# Patient Record
Sex: Male | Born: 1950 | Race: White | Hispanic: No | Marital: Married | Smoking: Never smoker
Health system: Southern US, Community
[De-identification: ages and names within clinical notes are randomized; demographics above are authoritative.]

## PROBLEM LIST (undated history)

## (undated) DIAGNOSIS — I10 Essential (primary) hypertension: Secondary | ICD-10-CM

## (undated) DIAGNOSIS — M1990 Unspecified osteoarthritis, unspecified site: Secondary | ICD-10-CM

## (undated) HISTORY — PX: COLONOSCOPY: SHX174

## (undated) HISTORY — PX: KNEE SURGERY: SHX244

## (undated) HISTORY — DX: Essential (primary) hypertension: I10

## (undated) HISTORY — PX: OTHER SURGICAL HISTORY: SHX169

## (undated) HISTORY — DX: Hemochromatosis, unspecified: E83.119

## (undated) HISTORY — DX: Unspecified osteoarthritis, unspecified site: M19.90

## (undated) HISTORY — PX: CARPAL TUNNEL RELEASE: SHX101

---

## 2004-03-04 ENCOUNTER — Ambulatory Visit: Payer: Self-pay | Admitting: Gastroenterology

## 2004-05-21 ENCOUNTER — Ambulatory Visit: Payer: Self-pay | Admitting: Gastroenterology

## 2006-06-12 ENCOUNTER — Other Ambulatory Visit: Payer: Self-pay

## 2006-06-12 ENCOUNTER — Ambulatory Visit: Payer: Self-pay | Admitting: Unknown Physician Specialty

## 2006-06-14 ENCOUNTER — Ambulatory Visit: Payer: Self-pay | Admitting: Unknown Physician Specialty

## 2008-03-12 ENCOUNTER — Ambulatory Visit: Payer: Self-pay | Admitting: Family Medicine

## 2008-07-25 ENCOUNTER — Ambulatory Visit: Payer: Self-pay | Admitting: Unknown Physician Specialty

## 2011-02-23 ENCOUNTER — Ambulatory Visit: Payer: Self-pay | Admitting: Gastroenterology

## 2011-04-25 ENCOUNTER — Ambulatory Visit: Payer: Self-pay | Admitting: Oncology

## 2011-05-18 ENCOUNTER — Ambulatory Visit: Payer: Self-pay | Admitting: Oncology

## 2011-05-23 LAB — CBC CANCER CENTER
Basophil #: 0.1 x10 3/mm (ref 0.0–0.1)
Basophil %: 1.1 %
Eosinophil #: 0.3 x10 3/mm (ref 0.0–0.7)
Eosinophil %: 4.4 %
HCT: 45.6 % (ref 40.0–52.0)
HGB: 15.3 g/dL (ref 13.0–18.0)
Lymphocyte #: 1.8 x10 3/mm (ref 1.0–3.6)
Lymphocyte %: 27.7 %
MCH: 29.9 pg (ref 26.0–34.0)
MCHC: 33.6 g/dL (ref 32.0–36.0)
MCV: 89 fL (ref 80–100)
Monocyte #: 0.6 x10 3/mm (ref 0.2–1.0)
Monocyte %: 10.2 %
Neutrophil #: 3.6 x10 3/mm (ref 1.4–6.5)
Neutrophil %: 56.6 %
Platelet: 230 x10 3/mm (ref 150–440)
RBC: 5.13 10*6/uL (ref 4.40–5.90)
RDW: 13.4 % (ref 11.5–14.5)
WBC: 6.3 x10 3/mm (ref 3.8–10.6)

## 2011-05-23 LAB — IRON AND TIBC
Iron Bind.Cap.(Total): 351 ug/dL (ref 250–450)
Iron Saturation: 39 %
Iron: 136 ug/dL (ref 65–175)
Unbound Iron-Bind.Cap.: 215 ug/dL

## 2011-05-23 LAB — FERRITIN: Ferritin (ARMC): 485 ng/mL — ABNORMAL HIGH (ref 8–388)

## 2011-06-18 ENCOUNTER — Ambulatory Visit: Payer: Self-pay | Admitting: Oncology

## 2011-07-22 ENCOUNTER — Ambulatory Visit: Payer: Self-pay | Admitting: Oncology

## 2011-07-22 LAB — CBC CANCER CENTER
Basophil #: 0.1 x10 3/mm (ref 0.0–0.1)
Basophil %: 0.9 %
Eosinophil #: 0.4 x10 3/mm (ref 0.0–0.7)
Eosinophil %: 5 %
HCT: 45.4 % (ref 40.0–52.0)
HGB: 15.2 g/dL (ref 13.0–18.0)
Lymphocyte #: 2.1 x10 3/mm (ref 1.0–3.6)
Lymphocyte %: 29.7 %
MCH: 29.8 pg (ref 26.0–34.0)
MCHC: 33.4 g/dL (ref 32.0–36.0)
MCV: 89 fL (ref 80–100)
Monocyte #: 0.6 x10 3/mm (ref 0.2–1.0)
Monocyte %: 8.1 %
Neutrophil #: 3.9 x10 3/mm (ref 1.4–6.5)
Neutrophil %: 56.3 %
Platelet: 214 x10 3/mm (ref 150–440)
RBC: 5.09 10*6/uL (ref 4.40–5.90)
RDW: 13.1 % (ref 11.5–14.5)
WBC: 7 x10 3/mm (ref 3.8–10.6)

## 2011-07-22 LAB — FERRITIN: Ferritin (ARMC): 398 ng/mL — ABNORMAL HIGH (ref 8–388)

## 2011-08-18 ENCOUNTER — Ambulatory Visit: Payer: Self-pay | Admitting: Oncology

## 2011-08-19 LAB — CBC CANCER CENTER
Basophil #: 0.1 x10 3/mm (ref 0.0–0.1)
Basophil %: 1.2 %
Eosinophil #: 0.4 x10 3/mm (ref 0.0–0.7)
Eosinophil %: 6.1 %
HCT: 45.8 % (ref 40.0–52.0)
HGB: 15.7 g/dL (ref 13.0–18.0)
Lymphocyte #: 2.2 x10 3/mm (ref 1.0–3.6)
Lymphocyte %: 31.2 %
MCH: 30.7 pg (ref 26.0–34.0)
MCHC: 34.3 g/dL (ref 32.0–36.0)
MCV: 90 fL (ref 80–100)
Monocyte #: 0.7 x10 3/mm (ref 0.2–1.0)
Monocyte %: 9.8 %
Neutrophil #: 3.7 x10 3/mm (ref 1.4–6.5)
Neutrophil %: 51.7 %
Platelet: 233 x10 3/mm (ref 150–440)
RBC: 5.12 10*6/uL (ref 4.40–5.90)
RDW: 13.4 % (ref 11.5–14.5)
WBC: 7.1 x10 3/mm (ref 3.8–10.6)

## 2011-08-19 LAB — IRON AND TIBC
Iron Bind.Cap.(Total): 354 ug/dL (ref 250–450)
Iron Saturation: 26 %
Iron: 92 ug/dL (ref 65–175)
Unbound Iron-Bind.Cap.: 262 ug/dL

## 2011-08-19 LAB — FERRITIN: Ferritin (ARMC): 324 ng/mL (ref 8–388)

## 2011-09-18 ENCOUNTER — Ambulatory Visit: Payer: Self-pay | Admitting: Oncology

## 2011-10-18 ENCOUNTER — Ambulatory Visit: Payer: Self-pay | Admitting: Oncology

## 2011-11-11 LAB — CBC CANCER CENTER
Basophil #: 0.1 x10 3/mm (ref 0.0–0.1)
Basophil %: 1.4 %
Eosinophil #: 0.3 x10 3/mm (ref 0.0–0.7)
Eosinophil %: 4.1 %
HCT: 47.8 % (ref 40.0–52.0)
HGB: 15.9 g/dL (ref 13.0–18.0)
Lymphocyte #: 2.6 x10 3/mm (ref 1.0–3.6)
Lymphocyte %: 34.9 %
MCH: 29.7 pg (ref 26.0–34.0)
MCHC: 33.3 g/dL (ref 32.0–36.0)
MCV: 89 fL (ref 80–100)
Monocyte #: 0.8 x10 3/mm (ref 0.2–1.0)
Monocyte %: 10.8 %
Neutrophil #: 3.6 x10 3/mm (ref 1.4–6.5)
Neutrophil %: 48.8 %
Platelet: 236 x10 3/mm (ref 150–440)
RBC: 5.37 10*6/uL (ref 4.40–5.90)
RDW: 13.3 % (ref 11.5–14.5)
WBC: 7.4 x10 3/mm (ref 3.8–10.6)

## 2011-11-11 LAB — IRON AND TIBC
Iron Bind.Cap.(Total): 376 ug/dL (ref 250–450)
Iron Saturation: 20 %
Iron: 75 ug/dL (ref 65–175)
Unbound Iron-Bind.Cap.: 301 ug/dL

## 2011-11-11 LAB — FERRITIN: Ferritin (ARMC): 233 ng/mL (ref 8–388)

## 2011-11-18 ENCOUNTER — Ambulatory Visit: Payer: Self-pay | Admitting: Oncology

## 2012-05-08 ENCOUNTER — Ambulatory Visit: Payer: Self-pay | Admitting: Internal Medicine

## 2012-05-08 LAB — IRON AND TIBC
Iron Bind.Cap.(Total): 361 ug/dL (ref 250–450)
Iron Saturation: 29 %
Iron: 103 ug/dL (ref 65–175)
Unbound Iron-Bind.Cap.: 258 ug/dL

## 2012-05-08 LAB — FERRITIN: Ferritin (ARMC): 271 ng/mL (ref 8–388)

## 2012-05-17 ENCOUNTER — Ambulatory Visit: Payer: Self-pay | Admitting: Internal Medicine

## 2013-12-16 ENCOUNTER — Emergency Department: Payer: Self-pay | Admitting: Emergency Medicine

## 2013-12-16 LAB — CBC WITH DIFFERENTIAL/PLATELET
Basophil #: 0 10*3/uL (ref 0.0–0.1)
Basophil %: 0.4 %
Eosinophil #: 0 10*3/uL (ref 0.0–0.7)
Eosinophil %: 0.3 %
HCT: 49.5 % (ref 40.0–52.0)
HGB: 16.5 g/dL (ref 13.0–18.0)
Lymphocyte #: 1.3 10*3/uL (ref 1.0–3.6)
Lymphocyte %: 11 %
MCH: 30.3 pg (ref 26.0–34.0)
MCHC: 33.3 g/dL (ref 32.0–36.0)
MCV: 91 fL (ref 80–100)
Monocyte #: 1 x10 3/mm (ref 0.2–1.0)
Monocyte %: 8.4 %
Neutrophil #: 9.1 10*3/uL — ABNORMAL HIGH (ref 1.4–6.5)
Neutrophil %: 79.9 %
Platelet: 233 10*3/uL (ref 150–440)
RBC: 5.44 10*6/uL (ref 4.40–5.90)
RDW: 12.9 % (ref 11.5–14.5)
WBC: 11.4 10*3/uL — ABNORMAL HIGH (ref 3.8–10.6)

## 2013-12-16 LAB — LIPASE, BLOOD: Lipase: 96 U/L (ref 73–393)

## 2013-12-16 LAB — URINALYSIS, COMPLETE
Bacteria: NONE SEEN
Bilirubin,UR: NEGATIVE
Glucose,UR: NEGATIVE mg/dL (ref 0–75)
Ketone: NEGATIVE
Leukocyte Esterase: NEGATIVE
Nitrite: NEGATIVE
Ph: 5 (ref 4.5–8.0)
Protein: NEGATIVE
RBC,UR: 7 /HPF (ref 0–5)
Specific Gravity: 1.015 (ref 1.003–1.030)
Squamous Epithelial: <1
WBC UR: <1 /HPF (ref 0–5)

## 2013-12-16 LAB — COMPREHENSIVE METABOLIC PANEL
Albumin: 3.8 g/dL (ref 3.4–5.0)
Alkaline Phosphatase: 69 U/L
Anion Gap: 9 (ref 7–16)
BUN: 26 mg/dL — ABNORMAL HIGH (ref 7–18)
Bilirubin,Total: 0.5 mg/dL (ref 0.2–1.0)
Calcium, Total: 8.6 mg/dL (ref 8.5–10.1)
Chloride: 104 mmol/L (ref 98–107)
Co2: 26 mmol/L (ref 21–32)
Creatinine: 1.35 mg/dL — ABNORMAL HIGH (ref 0.60–1.30)
EGFR (African American): >60
EGFR (Non-African Amer.): 57 — ABNORMAL LOW
Glucose: 98 mg/dL (ref 65–99)
Osmolality: 282 (ref 275–301)
Potassium: 3.5 mmol/L (ref 3.5–5.1)
SGOT(AST): 34 U/L (ref 15–37)
SGPT (ALT): 53 U/L
Sodium: 139 mmol/L (ref 136–145)
Total Protein: 7.3 g/dL (ref 6.4–8.2)

## 2013-12-16 LAB — TROPONIN I: Troponin-I: <0.02 ng/mL

## 2016-04-11 IMAGING — CT CT ABD-PELV W/ CM
2 of 5 series · 16 of 46 positions shown, 18 images · IV contrast (isovue)
Comparison: None.

CLINICAL DATA: Abdominal pain began at 4 a.m. today. Left lower
quadrant pain.

EXAM:
CT ABDOMEN AND PELVIS WITH CONTRAST
TECHNIQUE: Multidetector CT imaging of the abdomen and pelvis was performed
using the standard protocol following bolus administration of
intravenous contrast.
CONTRAST:  100 mL Isovue 370 IV .

[Series 2: routine abd pel with · axial · 0.86mm/px · z∈[-418,+62]mm · 13 of 110 slices shown, 15 images]
[im 7/110  soft-tissue]
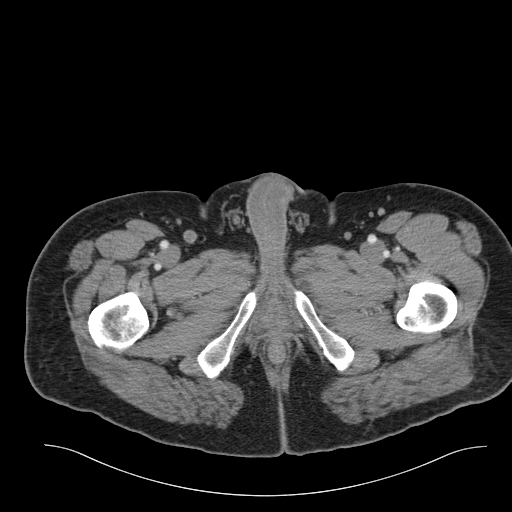
[im 7/110  bone]
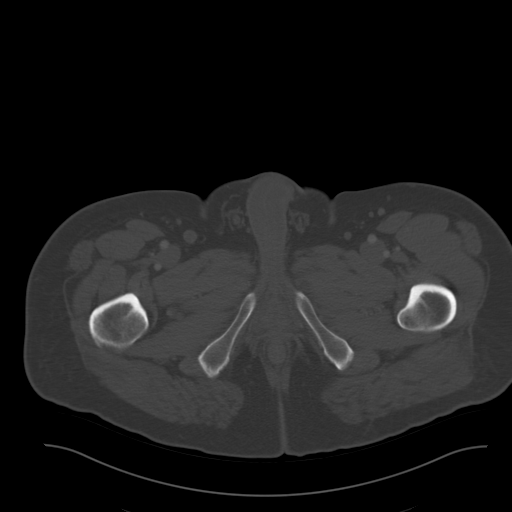
[im 13/110  soft-tissue]
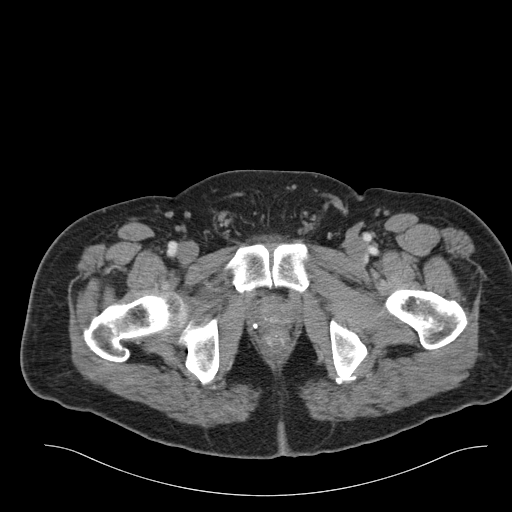
[im 26/110  soft-tissue]
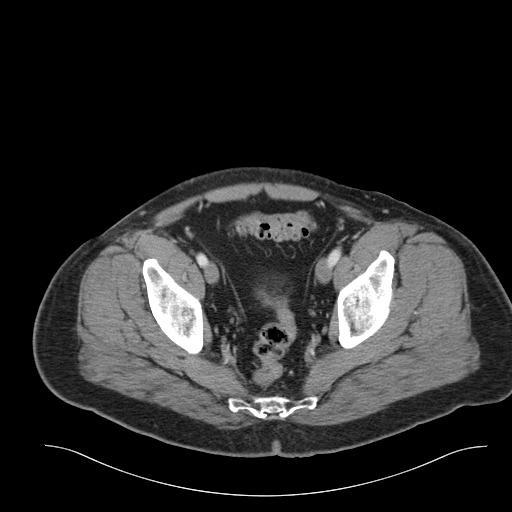
[im 33/110  soft-tissue]
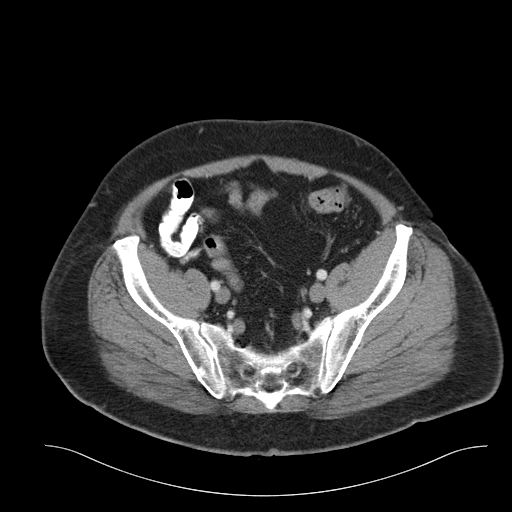
[im 39/110  soft-tissue]
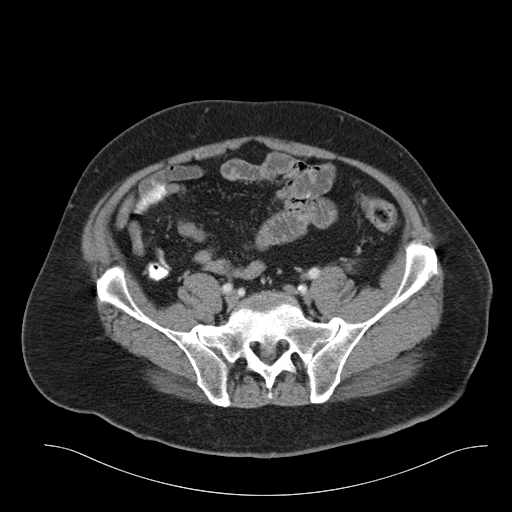
[im 45/110  soft-tissue]
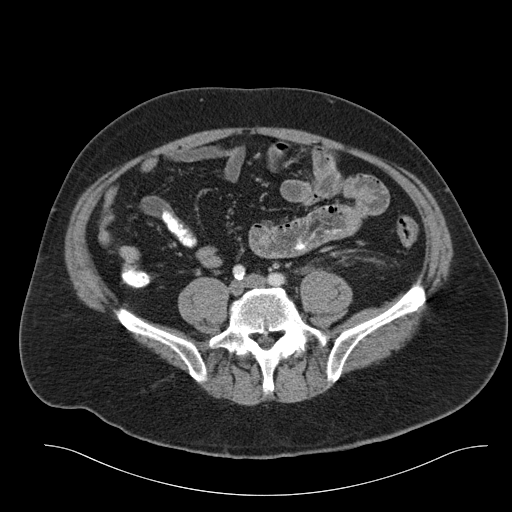
[im 58/110  soft-tissue]
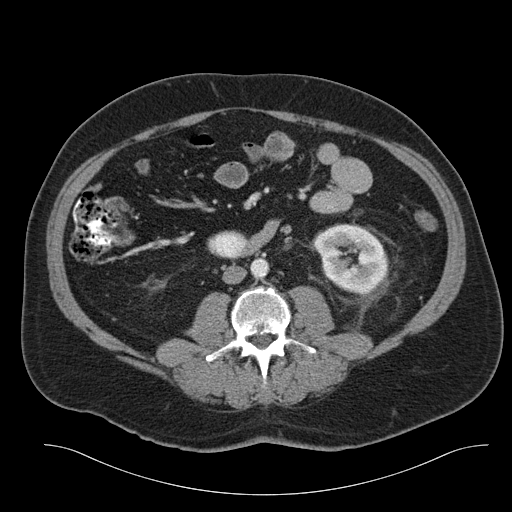
[im 65/110  soft-tissue]
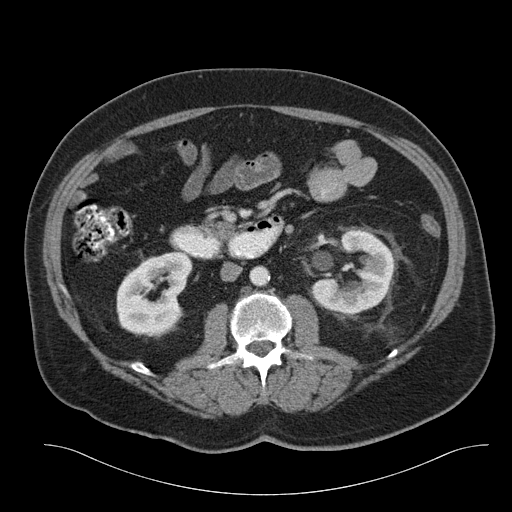
[im 71/110  soft-tissue]
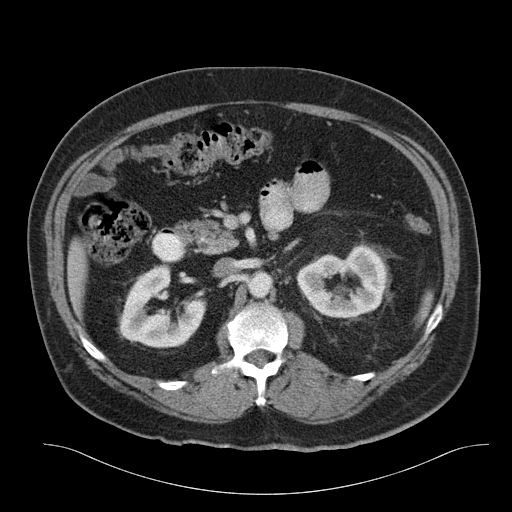
[im 71/110  bone]
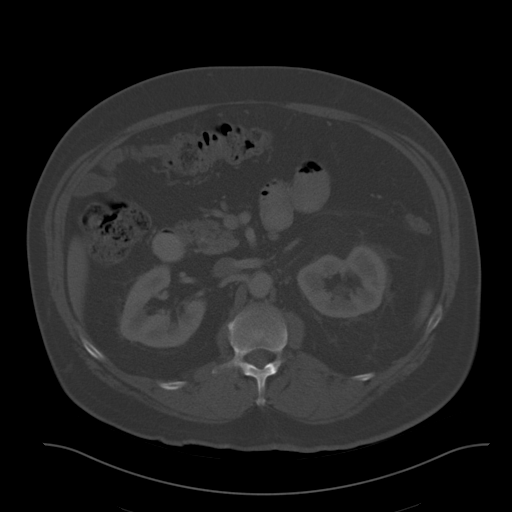
[im 77/110  soft-tissue]
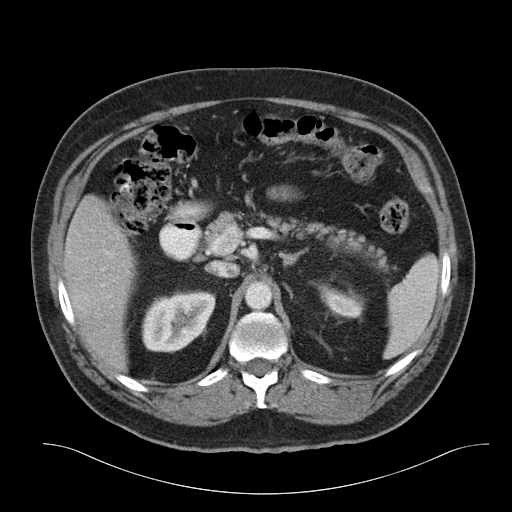
[im 84/110  soft-tissue]
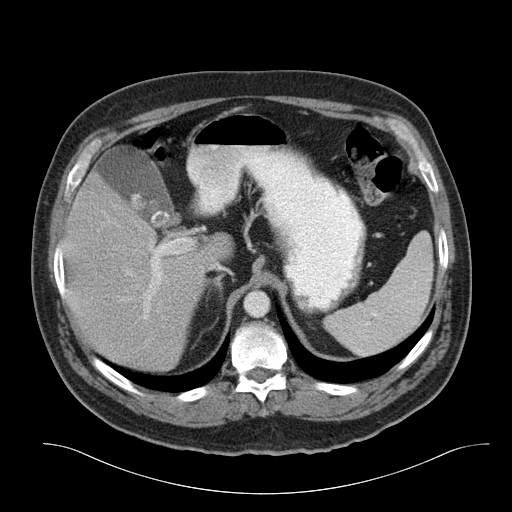
[im 97/110  soft-tissue]
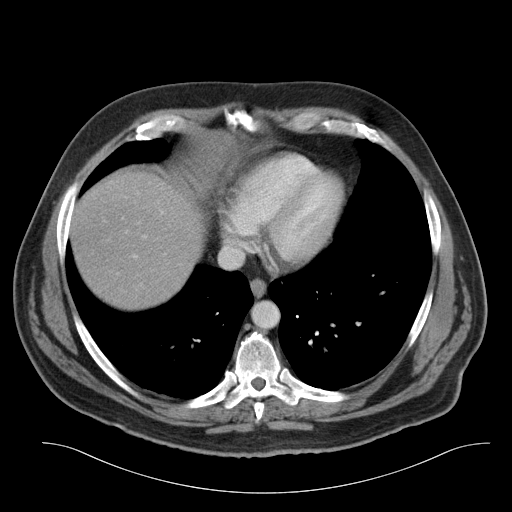
[im 103/110  soft-tissue]
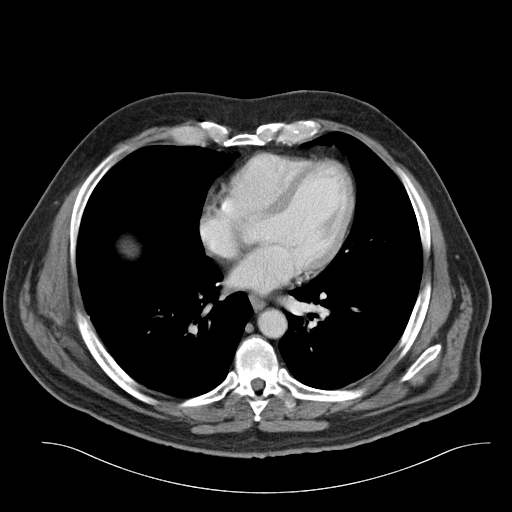

[Series 6: cor routine abd pel with · coronal · 0.80mm/px · 3 of 156 slices shown]
[im 52/156  soft-tissue]
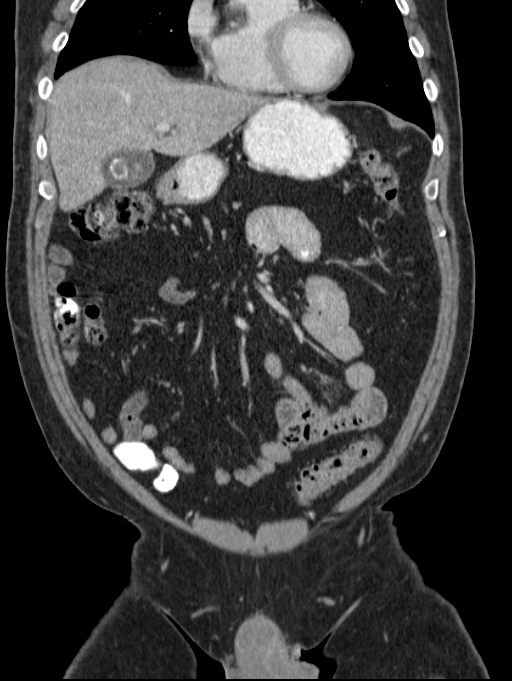
[im 69/156  soft-tissue]
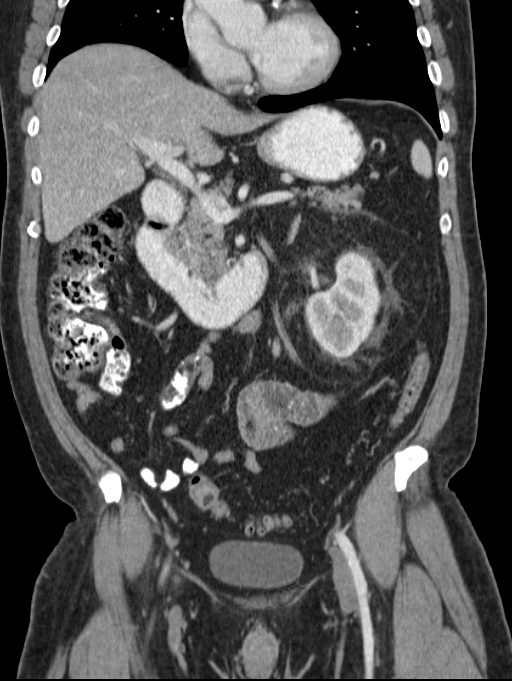
[im 87/156  soft-tissue]
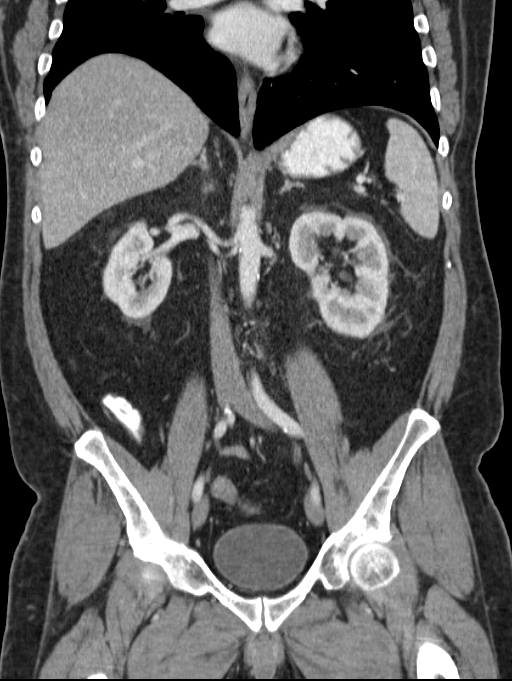

[16 of 46 positions shown; findings below may reference images not displayed]

FINDINGS: Lung bases are clear.  Heart size is normal.

Multiple calcified gallstones. Gallbladder wall not thickened. Liver
and bile ducts are normal. Pancreas and spleen are normal.

Mild left hydronephrosis. Perinephric edema is present around the
left kidney. Left ureter is dilated down to the bladder where there
is a 1 mm calculus in the distal left ureter. Right kidney is normal
without obstruction or mass or stone.

Mild sigmoid diverticulosis without diverticulitis change. No free
fluid. Negative for mass or adenopathy. Mild degenerative change at
L5-S1.
IMPRESSION: 1 mm calculus distal left ureter causing partial obstruction of the
left kidney.

Sigmoid diverticulosis

Cholelithiasis

## 2017-03-05 NOTE — Progress Notes (Signed)
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  INTERVAL HISTORY: Patient is a 67 year old male who was last evaluated in clinic over 6 years ago.  He has not had phlebotomy since that time.  Patient was noted to have an increasing ferritin of nearly 500 on routine blood work and has been referred back for evaluation.  He currently feels well and is asymptomatic.  He has no neurologic complaints.  He denies any recent fevers or illnesses.  He has a good appetite and denies weight loss.  He has no chest pain or shortness of breath.  He denies any nausea, vomiting, constipation, or diarrhea.  He has no urinary complaints.  Patient feels at his baseline offers no specific complaints today.  REVIEW OF SYSTEMS:   Review of Systems  Constitutional: Negative.  Negative for fever, malaise/fatigue and weight loss.  Respiratory: Negative.  Negative for cough and shortness of breath.   Cardiovascular: Negative.  Negative for chest pain and leg swelling.  Gastrointestinal: Negative.  Negative for abdominal pain, blood in stool and melena.  Genitourinary: Negative.  Negative for dysuria.  Musculoskeletal: Negative.   Skin: Negative.  Negative for rash.  Neurological: Negative.  Negative for sensory change and weakness.  Psychiatric/Behavioral: Negative.  The patient is not nervous/anxious.     As per HPI. Otherwise, a complete review of systems is negative.  PAST MEDICAL HISTORY: Past Medical History:  Diagnosis Date  . Arthritis   . Hemochromatosis   . Hypertension     PAST SURGICAL HISTORY: Right knee surgery x3, bilateral carpal tunnel surgery.  FAMILY HISTORY: Family History  Problem Relation Age of Onset  . Cirrhosis Mother   .  Hemachromatosis Mother   . Coronary artery disease Father   . Dementia Father   . Prostate cancer Father   . Arthritis Sister   . Pancreatic disease Brother     ADVANCED DIRECTIVES (Y/N):  N  HEALTH MAINTENANCE: Social History   Tobacco Use  . Smoking status: Former Smoker    Packs/day: 2.00    Years: 15.00    Pack years: 30.00    Types: Cigarettes    Last attempt to quit: 03/09/1978    Years since quitting: 39.0  . Smokeless tobacco: Former NeurosurgeonUser    Types: Chew    Quit date: 03/10/2015  . Tobacco comment: used chew x 2 yr   Substance Use Topics  . Alcohol use: No    Frequency: Never    Comment: socially /rarely  . Drug use: No     Colonoscopy:  PAP:  Bone density:  Lipid panel:  No Known Allergies  Current Outpatient Medications  Medication Sig Dispense Refill  . aspirin EC 81 MG tablet Take 81 mg by mouth daily.    . valsartan-hydrochlorothiazide (DIOVAN-HCT) 320-25 MG tablet Take 1 tablet by mouth daily.     . Vitamin D, Ergocalciferol, (DRISDOL) 50000 units CAPS capsule Take 50,000 Units by mouth every 7 (seven) days.     . naproxen sodium (ALEVE) 220 MG tablet Take 220 mg by mouth daily as needed.     No current facility-administered medications for this visit.     OBJECTIVE: Vitals:   03/09/17 1329  BP: 127/79  Pulse: 75  Resp: 18  Temp: (!) 96 F (35.6  C)     Body mass index is 33.47 kg/m.    ECOG FS:0 - Asymptomatic  General: Well-developed, well-nourished, no acute distress. Eyes: Pink conjunctiva, anicteric sclera. HEENT: Normocephalic, moist mucous membranes, clear oropharnyx. Lungs: Clear to auscultation bilaterally. Heart: Regular rate and rhythm. No rubs, murmurs, or gallops. Abdomen: Soft, nontender, nondistended. No organomegaly noted, normoactive bowel sounds. Musculoskeletal: No edema, cyanosis, or clubbing. Neuro: Alert, answering all questions appropriately. Cranial nerves grossly intact. Skin: No rashes or petechiae noted. Psych:  Normal affect. Lymphatics: No cervical, calvicular, axillary or inguinal LAD.   LAB RESULTS:  Lab Results  Component Value Date   NA 139 12/16/2013   K 3.5 12/16/2013   CL 104 12/16/2013   CO2 26 12/16/2013   GLUCOSE 98 12/16/2013   BUN 26 (H) 12/16/2013   CREATININE 1.35 (H) 12/16/2013   CALCIUM 8.6 12/16/2013   PROT 7.3 12/16/2013   ALBUMIN 3.8 12/16/2013   AST 34 12/16/2013   ALT 53 12/16/2013   ALKPHOS 69 12/16/2013   BILITOT 0.5 12/16/2013   GFRNONAA 57 (L) 12/16/2013   GFRAA >60 12/16/2013    Lab Results  Component Value Date   WBC 11.4 (H) 12/16/2013   NEUTROABS 9.1 (H) 12/16/2013   HGB 16.5 12/16/2013   HCT 49.5 12/16/2013   MCV 91 12/16/2013   PLT 233 12/16/2013     STUDIES: No results found.  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  PLAN:    1. 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  Patient's goal ferritin will be 50-100.  Patient will return to clinic next week for 400 mL of phlebotomy and then return to clinic in 1 month for repeat laboratory work, further evaluation, and continuation of phlebotomy.  Approximately 45 minutes was spent in discussion of which greater than 50% was consultation.  Patient expressed understanding and was in agreement with this plan. He also understands that He can call clinic at any time with any questions, concerns, or complaints.    Jeralyn Ruths, MD   03/09/2017 5:01 PM

## 2017-03-09 ENCOUNTER — Other Ambulatory Visit: Payer: Self-pay

## 2017-03-09 ENCOUNTER — Encounter: Payer: Self-pay | Admitting: Oncology

## 2017-03-09 ENCOUNTER — Inpatient Hospital Stay: Payer: BLUE CROSS/BLUE SHIELD | Attending: Oncology | Admitting: Oncology

## 2017-03-09 NOTE — Progress Notes (Signed)
Here for new pt evaluation. Treated in past per pt for hemochromatosis .

## 2017-03-10 ENCOUNTER — Inpatient Hospital Stay: Payer: BLUE CROSS/BLUE SHIELD

## 2017-04-09 NOTE — Progress Notes (Deleted)
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  INTERVAL HISTORY: Patient is a 67 year old male who was last evaluated in clinic over 6 years ago.  He has not had phlebotomy since that time.  Patient was noted to have an increasing ferritin of nearly 500 on routine blood work and has been referred back for evaluation.  He currently feels well and is asymptomatic.  He has no neurologic complaints.  He denies any recent fevers or illnesses.  He has a good appetite and denies weight loss.  He has no chest pain or shortness of breath.  He denies any nausea, vomiting, constipation, or diarrhea.  He has no urinary complaints.  Patient feels at his baseline offers no specific complaints today.  REVIEW OF SYSTEMS:   Review of Systems  Constitutional: Negative.  Negative for fever, malaise/fatigue and weight loss.  Respiratory: Negative.  Negative for cough and shortness of breath.   Cardiovascular: Negative.  Negative for chest pain and leg swelling.  Gastrointestinal: Negative.  Negative for abdominal pain, blood in stool and melena.  Genitourinary: Negative.  Negative for dysuria.  Musculoskeletal: Negative.   Skin: Negative.  Negative for rash.  Neurological: Negative.  Negative for sensory change and weakness.  Psychiatric/Behavioral: Negative.  The patient is not nervous/anxious.     As per HPI. Otherwise, a complete review of systems is negative.  PAST MEDICAL HISTORY: Past Medical History:  Diagnosis Date  . Arthritis   . Hemochromatosis   . Hypertension     PAST SURGICAL HISTORY: Right knee surgery x3, bilateral carpal tunnel surgery.  FAMILY HISTORY: Family History  Problem Relation Age of Onset  . Cirrhosis Mother   .  Hemachromatosis Mother   . Coronary artery disease Father   . Dementia Father   . Prostate cancer Father   . Arthritis Sister   . Pancreatic disease Brother     ADVANCED DIRECTIVES (Y/N):  N  HEALTH MAINTENANCE: Social History   Tobacco Use  . Smoking status: Former Smoker    Packs/day: 2.00    Years: 15.00    Pack years: 30.00    Types: Cigarettes    Last attempt to quit: 03/09/1978    Years since quitting: 39.1  . Smokeless tobacco: Former NeurosurgeonUser    Types: Chew    Quit date: 03/10/2015  . Tobacco comment: used chew x 2 yr   Substance Use Topics  . Alcohol use: No    Frequency: Never    Comment: socially /rarely  . Drug use: No     Colonoscopy:  PAP:  Bone density:  Lipid panel:  No Known Allergies  Current Outpatient Medications  Medication Sig Dispense Refill  . aspirin EC 81 MG tablet Take 81 mg by mouth daily.    . naproxen sodium (ALEVE) 220 MG tablet Take 220 mg by mouth daily as needed.    . valsartan-hydrochlorothiazide (DIOVAN-HCT) 320-25 MG tablet Take 1 tablet by mouth daily.      No current facility-administered medications for this visit.     OBJECTIVE: There were no vitals filed for this visit.   There is no height or weight on file to calculate BMI.    ECOG FS:0 - Asymptomatic  General: Well-developed, well-nourished, no acute distress. Eyes: Pink conjunctiva, anicteric sclera. HEENT: Normocephalic,  moist mucous membranes, clear oropharnyx. Lungs: Clear to auscultation bilaterally. Heart: Regular rate and rhythm. No rubs, murmurs, or gallops. Abdomen: Soft, nontender, nondistended. No organomegaly noted, normoactive bowel sounds. Musculoskeletal: No edema, cyanosis, or clubbing. Neuro: Alert, answering all questions appropriately. Cranial nerves grossly intact. Skin: No rashes or petechiae noted. Psych: Normal affect. Lymphatics: No cervical, calvicular, axillary or inguinal LAD.   LAB RESULTS:  Lab Results  Component Value Date   NA 139  12/16/2013   K 3.5 12/16/2013   CL 104 12/16/2013   CO2 26 12/16/2013   GLUCOSE 98 12/16/2013   BUN 26 (H) 12/16/2013   CREATININE 1.35 (H) 12/16/2013   CALCIUM 8.6 12/16/2013   PROT 7.3 12/16/2013   ALBUMIN 3.8 12/16/2013   AST 34 12/16/2013   ALT 53 12/16/2013   ALKPHOS 69 12/16/2013   BILITOT 0.5 12/16/2013   GFRNONAA 57 (L) 12/16/2013   GFRAA >60 12/16/2013    Lab Results  Component Value Date   WBC 11.4 (H) 12/16/2013   NEUTROABS 9.1 (H) 12/16/2013   HGB 16.5 12/16/2013   HCT 49.5 12/16/2013   MCV 91 12/16/2013   PLT 233 12/16/2013     STUDIES: No results found.  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  PLAN:    1. 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  Patient's goal ferritin will be 50-100.  Patient will return to clinic next week for 400 mL of phlebotomy and then return to clinic in 1 month for repeat laboratory work, further evaluation, and continuation of phlebotomy.  Approximately 45 minutes was spent in discussion of which greater than 50% was consultation.  Patient expressed understanding and was in agreement with this plan. He also understands that He can call clinic at any time with any questions, concerns, or complaints.    Jeralyn Ruths, MD   04/09/2017 9:44 PM

## 2017-04-11 ENCOUNTER — Inpatient Hospital Stay: Payer: BLUE CROSS/BLUE SHIELD

## 2017-04-11 ENCOUNTER — Inpatient Hospital Stay: Payer: BLUE CROSS/BLUE SHIELD | Attending: Oncology | Admitting: Oncology

## 2017-04-11 ENCOUNTER — Other Ambulatory Visit: Payer: BLUE CROSS/BLUE SHIELD

## 2017-04-11 ENCOUNTER — Encounter: Payer: Self-pay | Admitting: Oncology

## 2017-04-11 ENCOUNTER — Other Ambulatory Visit: Payer: Self-pay

## 2017-04-11 ENCOUNTER — Inpatient Hospital Stay: Payer: BLUE CROSS/BLUE SHIELD | Admitting: Oncology

## 2017-04-11 ENCOUNTER — Ambulatory Visit: Payer: BLUE CROSS/BLUE SHIELD | Admitting: Oncology

## 2017-04-11 DIAGNOSIS — Z87891 Personal history of nicotine dependence: Secondary | ICD-10-CM

## 2017-04-11 LAB — CBC WITH DIFFERENTIAL/PLATELET
Basophils Absolute: 0.1 10*3/uL (ref 0–0.1)
Basophils Relative: 1 %
Eosinophils Absolute: 0.3 10*3/uL (ref 0–0.7)
Eosinophils Relative: 5 %
HCT: 45.3 % (ref 40.0–52.0)
Hemoglobin: 16 g/dL (ref 13.0–18.0)
Lymphocytes Relative: 30 %
Lymphs Abs: 1.6 10*3/uL (ref 1.0–3.6)
MCH: 31.1 pg (ref 26.0–34.0)
MCHC: 35.3 g/dL (ref 32.0–36.0)
MCV: 88 fL (ref 80.0–100.0)
Monocytes Absolute: 0.5 10*3/uL (ref 0.2–1.0)
Monocytes Relative: 10 %
Neutro Abs: 2.9 10*3/uL (ref 1.4–6.5)
Neutrophils Relative %: 54 %
Platelets: 243 10*3/uL (ref 150–440)
RBC: 5.14 MIL/uL (ref 4.40–5.90)
RDW: 13.2 % (ref 11.5–14.5)
WBC: 5.3 10*3/uL (ref 3.8–10.6)

## 2017-04-11 LAB — IRON AND TIBC
Iron: 122 ug/dL (ref 45–182)
Saturation Ratios: 33 % (ref 17.9–39.5)
TIBC: 366 ug/dL (ref 250–450)
UIBC: 244 ug/dL

## 2017-04-11 LAB — FERRITIN: Ferritin: 309 ng/mL (ref 24–336)

## 2017-04-11 NOTE — Progress Notes (Signed)
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  INTERVAL HISTORY: Patient returns to clinic today for further evaluation and consideration of additional phlebotomy.  He continues to feel well and is asymptomatic.  He has no neurologic complaints.  He denies any recent fevers or illnesses.  He has a good appetite and denies weight loss.  He has no chest pain or shortness of breath.  He denies any nausea, vomiting, constipation, or diarrhea.  He has no urinary complaints.  Patient offers no specific complaints today.  REVIEW OF SYSTEMS:   Review of Systems  Constitutional: Negative.  Negative for fever, malaise/fatigue and weight loss.  Respiratory: Negative.  Negative for cough and shortness of breath.   Cardiovascular: Negative.  Negative for chest pain and leg swelling.  Gastrointestinal: Negative.  Negative for abdominal pain, blood in stool and melena.  Genitourinary: Negative.  Negative for dysuria.  Musculoskeletal: Negative.   Skin: Negative.  Negative for rash.  Neurological: Negative.  Negative for sensory change and weakness.  Psychiatric/Behavioral: Negative.  The patient is not nervous/anxious.     As per HPI. Otherwise, a complete review of systems is negative.  PAST MEDICAL HISTORY: Past Medical History:  Diagnosis Date  . Arthritis   . Hemochromatosis   . Hypertension     PAST SURGICAL HISTORY: Right knee surgery x3, bilateral carpal tunnel surgery.  FAMILY HISTORY: Family History  Problem Relation Age of Onset  . Cirrhosis Mother   . Hemachromatosis Mother   . Coronary artery disease Father   . Dementia Father   . Prostate cancer Father   . Arthritis Sister   . Pancreatic disease Brother     ADVANCED  DIRECTIVES (Y/N):  N  HEALTH MAINTENANCE: Social History   Tobacco Use  . Smoking status: Former Smoker    Packs/day: 2.00    Years: 15.00    Pack years: 30.00    Types: Cigarettes    Last attempt to quit: 03/09/1978    Years since quitting: 39.1  . Smokeless tobacco: Former NeurosurgeonUser    Types: Chew    Quit date: 03/10/2015  . Tobacco comment: used chew x 2 yr   Substance Use Topics  . Alcohol use: No    Frequency: Never    Comment: socially /rarely  . Drug use: No     Colonoscopy:  PAP:  Bone density:  Lipid panel:  No Known Allergies  Current Outpatient Medications  Medication Sig Dispense Refill  . aspirin EC 81 MG tablet Take 81 mg by mouth daily.    . naproxen sodium (ALEVE) 220 MG tablet Take 220 mg by mouth daily as needed.    . valsartan-hydrochlorothiazide (DIOVAN-HCT) 320-25 MG tablet Take 1 tablet by mouth daily.      No current facility-administered medications for this visit.     OBJECTIVE: Vitals:   04/11/17 1012  BP: 113/76  Pulse: 71  Temp: (!) 96.1 F (35.6 C)     Body mass index is 33.22 kg/m.    ECOG FS:0 - Asymptomatic  General: Well-developed, well-nourished, no acute distress. Eyes: Pink conjunctiva, anicteric sclera. Lungs: Clear to auscultation bilaterally. Heart: Regular rate and rhythm. No rubs, murmurs, or gallops. Abdomen: Soft, nontender, nondistended. No organomegaly noted, normoactive bowel sounds. Musculoskeletal: No edema, cyanosis, or clubbing. Neuro:  Alert, answering all questions appropriately. Cranial nerves grossly intact. Skin: No rashes or petechiae noted. Psych: Normal affect.   LAB RESULTS:  Lab Results  Component Value Date   NA 139 12/16/2013   K 3.5 12/16/2013   CL 104 12/16/2013   CO2 26 12/16/2013   GLUCOSE 98 12/16/2013   BUN 26 (H) 12/16/2013   CREATININE 1.35 (H) 12/16/2013   CALCIUM 8.6 12/16/2013   PROT 7.3 12/16/2013   ALBUMIN 3.8 12/16/2013   AST 34 12/16/2013   ALT 53 12/16/2013   ALKPHOS 69  12/16/2013   BILITOT 0.5 12/16/2013   GFRNONAA 57 (L) 12/16/2013   GFRAA >60 12/16/2013    Lab Results  Component Value Date   WBC 5.3 04/11/2017   NEUTROABS 2.9 04/11/2017   HGB 16.0 04/11/2017   HCT 45.3 04/11/2017   MCV 88.0 04/11/2017   PLT 243 04/11/2017     STUDIES: No results found.  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  PLAN:    1. 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  Previously his ferritin was 496, but has trended down to 309 after 400 mL phlebotomy. Patient's goal ferritin will be 50-100.  Proceed with additional phlebotomy today and return to clinic in 1 month for further evaluation.  Patient states he will get laboratory work at his place of employment 1-2 days prior to appointment to assess whether he needs phlebotomy.    Approximately 20 minutes was spent in discussion of which greater than 50% was consultation.  Patient expressed understanding and was in agreement with this plan. He also understands that He can call clinic at any time with any questions, concerns, or complaints.    Jeralyn Ruths, MD   04/11/2017 10:34 AM

## 2017-04-11 NOTE — Progress Notes (Signed)
Proceed with phlebotomy today per Dr. Orlie DakinFinnegan, based on HGB, HCT.

## 2017-04-12 ENCOUNTER — Other Ambulatory Visit: Payer: BLUE CROSS/BLUE SHIELD

## 2017-04-12 ENCOUNTER — Inpatient Hospital Stay: Payer: BLUE CROSS/BLUE SHIELD

## 2017-04-12 ENCOUNTER — Ambulatory Visit: Payer: BLUE CROSS/BLUE SHIELD | Admitting: Oncology

## 2017-04-12 ENCOUNTER — Inpatient Hospital Stay: Payer: BLUE CROSS/BLUE SHIELD | Admitting: Oncology

## 2017-05-07 NOTE — Progress Notes (Deleted)
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  INTERVAL HISTORY: Patient is a 67 year old male who was last evaluated in clinic over 6 years ago.  He has not had phlebotomy since that time.  Patient was noted to have an increasing ferritin of nearly 500 on routine blood work and has been referred back for evaluation.  He currently feels well and is asymptomatic.  He has no neurologic complaints.  He denies any recent fevers or illnesses.  He has a good appetite and denies weight loss.  He has no chest pain or shortness of breath.  He denies any nausea, vomiting, constipation, or diarrhea.  He has no urinary complaints.  Patient feels at his baseline offers no specific complaints today.  REVIEW OF SYSTEMS:   Review of Systems  Constitutional: Negative.  Negative for fever, malaise/fatigue and weight loss.  Respiratory: Negative.  Negative for cough and shortness of breath.   Cardiovascular: Negative.  Negative for chest pain and leg swelling.  Gastrointestinal: Negative.  Negative for abdominal pain, blood in stool and melena.  Genitourinary: Negative.  Negative for dysuria.  Musculoskeletal: Negative.   Skin: Negative.  Negative for rash.  Neurological: Negative.  Negative for sensory change and weakness.  Psychiatric/Behavioral: Negative.  The patient is not nervous/anxious.     As per HPI. Otherwise, a complete review of systems is negative.  PAST MEDICAL HISTORY: Past Medical History:  Diagnosis Date  . Arthritis   . Hemochromatosis   . Hypertension     PAST SURGICAL HISTORY: Right knee surgery x3, bilateral carpal tunnel surgery.  FAMILY HISTORY: Family History  Problem Relation Age of Onset  . Cirrhosis Mother   .  Hemachromatosis Mother   . Coronary artery disease Father   . Dementia Father   . Prostate cancer Father   . Arthritis Sister   . Pancreatic disease Brother     ADVANCED DIRECTIVES (Y/N):  N  HEALTH MAINTENANCE: Social History   Tobacco Use  . Smoking status: Former Smoker    Packs/day: 2.00    Years: 15.00    Pack years: 30.00    Types: Cigarettes    Last attempt to quit: 03/09/1978    Years since quitting: 39.1  . Smokeless tobacco: Former NeurosurgeonUser    Types: Chew    Quit date: 03/10/2015  . Tobacco comment: used chew x 2 yr   Substance Use Topics  . Alcohol use: No    Frequency: Never    Comment: socially /rarely  . Drug use: No     Colonoscopy:  PAP:  Bone density:  Lipid panel:  No Known Allergies  Current Outpatient Medications  Medication Sig Dispense Refill  . aspirin EC 81 MG tablet Take 81 mg by mouth daily.    . naproxen sodium (ALEVE) 220 MG tablet Take 220 mg by mouth daily as needed.    . valsartan-hydrochlorothiazide (DIOVAN-HCT) 320-25 MG tablet Take 1 tablet by mouth daily.     . Vitamin D, Ergocalciferol, (DRISDOL) 50000 units CAPS capsule Take 50,000 Units by mouth every 7 (seven) days.     No current facility-administered medications for this visit.     OBJECTIVE: There were no vitals filed for this visit.   There is no height or weight on file to calculate BMI.  ECOG FS:0 - Asymptomatic  General: Well-developed, well-nourished, no acute distress. Eyes: Pink conjunctiva, anicteric sclera. HEENT: Normocephalic, moist mucous membranes, clear oropharnyx. Lungs: Clear to auscultation bilaterally. Heart: Regular rate and rhythm. No rubs, murmurs, or gallops. Abdomen: Soft, nontender, nondistended. No organomegaly noted, normoactive bowel sounds. Musculoskeletal: No edema, cyanosis, or clubbing. Neuro: Alert, answering all questions appropriately. Cranial nerves grossly intact. Skin: No rashes or petechiae noted. Psych: Normal affect. Lymphatics:  No cervical, calvicular, axillary or inguinal LAD.   LAB RESULTS:  Lab Results  Component Value Date   NA 139 12/16/2013   K 3.5 12/16/2013   CL 104 12/16/2013   CO2 26 12/16/2013   GLUCOSE 98 12/16/2013   BUN 26 (H) 12/16/2013   CREATININE 1.35 (H) 12/16/2013   CALCIUM 8.6 12/16/2013   PROT 7.3 12/16/2013   ALBUMIN 3.8 12/16/2013   AST 34 12/16/2013   ALT 53 12/16/2013   ALKPHOS 69 12/16/2013   BILITOT 0.5 12/16/2013   GFRNONAA 57 (L) 12/16/2013   GFRAA >60 12/16/2013    Lab Results  Component Value Date   WBC 5.3 04/11/2017   NEUTROABS 2.9 04/11/2017   HGB 16.0 04/11/2017   HCT 45.3 04/11/2017   MCV 88.0 04/11/2017   PLT 243 04/11/2017     STUDIES: No results found.  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  PLAN:    1. 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  Patient's goal ferritin will be 50-100.  Patient will return to clinic next week for 400 mL of phlebotomy and then return to clinic in 1 month for repeat laboratory work, further evaluation, and continuation of phlebotomy.  Approximately 45 minutes was spent in discussion of which greater than 50% was consultation.  Patient expressed understanding and was in agreement with this plan. He also understands that He can call clinic at any time with any questions, concerns, or complaints.    Jeralyn Ruths, MD   05/07/2017 11:43 PM

## 2017-05-09 ENCOUNTER — Ambulatory Visit: Payer: BLUE CROSS/BLUE SHIELD | Admitting: Oncology

## 2017-07-23 NOTE — Progress Notes (Signed)
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  INTERVAL HISTORY: Patient returns to clinic today for repeat laboratory work, evaluation, and consideration of additional phlebotomy.  He continues to feel well and remains asymptomatic. He has no neurologic complaints.  He denies any recent fevers or illnesses.  He has a good appetite and denies weight loss.  He has no chest pain or shortness of breath.  He denies any nausea, vomiting, constipation, or diarrhea.  He has no urinary complaints.  Patient feels at his baseline offers no specific complaints today.  REVIEW OF SYSTEMS:   Review of Systems  Constitutional: Negative.  Negative for fever, malaise/fatigue and weight loss.  Respiratory: Negative.  Negative for cough and shortness of breath.   Cardiovascular: Negative.  Negative for chest pain and leg swelling.  Gastrointestinal: Negative.  Negative for abdominal pain, blood in stool and melena.  Genitourinary: Negative.  Negative for dysuria.  Musculoskeletal: Negative.  Negative for back pain.  Skin: Negative.  Negative for rash.  Neurological: Negative.  Negative for sensory change, focal weakness, weakness and headaches.  Psychiatric/Behavioral: Negative.  The patient is not nervous/anxious.     As per HPI. Otherwise, a complete review of systems is negative.  PAST MEDICAL HISTORY: Past Medical History:  Diagnosis Date  . Arthritis   . Hemochromatosis   . Hypertension     PAST SURGICAL HISTORY: Right knee surgery x3, bilateral carpal tunnel surgery.  FAMILY HISTORY: Family History  Problem Relation Age of Onset  . Cirrhosis Mother   . Hemachromatosis Mother   . Coronary artery disease Father   . Dementia Father   .  Prostate cancer Father   . Arthritis Sister   . Pancreatic disease Brother     ADVANCED DIRECTIVES (Y/N):  N  HEALTH MAINTENANCE: Social History   Tobacco Use  . Smoking status: Former Smoker    Packs/day: 2.00    Years: 15.00    Pack years: 30.00    Types: Cigarettes    Last attempt to quit: 03/09/1978    Years since quitting: 39.4  . Smokeless tobacco: Former NeurosurgeonUser    Types: Chew    Quit date: 03/10/2015  . Tobacco comment: used chew x 2 yr   Substance Use Topics  . Alcohol use: No    Frequency: Never    Comment: socially /rarely  . Drug use: No     Colonoscopy:  PAP:  Bone density:  Lipid panel:  No Known Allergies  Current Outpatient Medications  Medication Sig Dispense Refill  . aspirin EC 81 MG tablet Take 81 mg by mouth daily.    . valsartan-hydrochlorothiazide (DIOVAN-HCT) 320-25 MG tablet Take 1 tablet by mouth daily.     . naproxen sodium (ALEVE) 220 MG tablet Take 220 mg by mouth daily as needed.    . Vitamin D, Ergocalciferol, (DRISDOL) 50000 units CAPS capsule Take 50,000 Units by mouth every 7 (seven) days.     No current facility-administered medications for this visit.     OBJECTIVE: Vitals:   07/28/17 1006  BP: 126/79  Pulse: 80  Resp: 18  Temp: (!) 96 F (35.6 C)     Body mass index is 33.46 kg/m.    ECOG FS:0 - Asymptomatic  General: Well-developed, well-nourished, no acute distress. Eyes:  Pink conjunctiva, anicteric sclera. HEENT: Normocephalic, moist mucous membranes, clear oropharnyx. Lungs: Clear to auscultation bilaterally. Heart: Regular rate and rhythm. No rubs, murmurs, or gallops. Abdomen: Soft, nontender, nondistended. No organomegaly noted, normoactive bowel sounds. Musculoskeletal: No edema, cyanosis, or clubbing. Neuro: Alert, answering all questions appropriately. Cranial nerves grossly intact. Skin: No rashes or petechiae noted. Psych: Normal affect.   LAB RESULTS:  Lab Results  Component Value Date   NA 139  12/16/2013   K 3.5 12/16/2013   CL 104 12/16/2013   CO2 26 12/16/2013   GLUCOSE 98 12/16/2013   BUN 26 (H) 12/16/2013   CREATININE 1.35 (H) 12/16/2013   CALCIUM 8.6 12/16/2013   PROT 7.3 12/16/2013   ALBUMIN 3.8 12/16/2013   AST 34 12/16/2013   ALT 53 12/16/2013   ALKPHOS 69 12/16/2013   BILITOT 0.5 12/16/2013   GFRNONAA 57 (L) 12/16/2013   GFRAA >60 12/16/2013    Lab Results  Component Value Date   WBC 5.3 04/11/2017   NEUTROABS 2.9 04/11/2017   HGB 16.0 04/11/2017   HCT 45.3 04/11/2017   MCV 88.0 04/11/2017   PLT 243 04/11/2017     STUDIES: No results found.  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  PLAN:    1. 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  Patient's ferritin has trended back up and is now 411.  The remainder of his CBC and iron stores are within normal limits.  Goal ferritin is 50-100.  Proceed with 400 mL phlebotomy today.  Discussed having phlebotomy completed by [facility], but patient's current work schedule makes this difficult.  Return to clinic in 2 months with repeat laboratory work and further evaluation.  Patient gets all of his laboratory work at [employer].   I spent a total of 30 minutes face-to-face with the patient of which greater than 50% of the visit was spent in counseling and coordination of care as detailed above.   Patient expressed understanding and was in agreement with this plan. He also understands that He can call clinic at any time with any questions, concerns, or complaints.    Jeralyn Ruths, MD   08/03/2017 1:33 PM

## 2017-07-28 ENCOUNTER — Other Ambulatory Visit: Payer: Self-pay

## 2017-07-28 ENCOUNTER — Inpatient Hospital Stay: Payer: BLUE CROSS/BLUE SHIELD | Attending: Oncology | Admitting: Oncology

## 2017-07-28 ENCOUNTER — Inpatient Hospital Stay: Payer: BLUE CROSS/BLUE SHIELD

## 2017-07-28 DIAGNOSIS — I10 Essential (primary) hypertension: Secondary | ICD-10-CM | POA: Insufficient documentation

## 2017-07-28 DIAGNOSIS — Z87891 Personal history of nicotine dependence: Secondary | ICD-10-CM

## 2017-07-28 NOTE — Progress Notes (Signed)
Here for follow up. Per pt " Im doing good -working all the time "  Per pt DR Hedricks started him on Drisdol  (pt took x 3 mo ) gave him joint pain so stopped few mo ago- feeling better.

## 2017-09-18 NOTE — Progress Notes (Signed)
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  INTERVAL HISTORY: Patient returns to clinic today for repeat laboratory work, further evaluation, and consideration of additional phlebotomy.  He continues to feel well and remains asymptomatic. He has no neurologic complaints.  He denies any recent fevers or illnesses.  He has a good appetite and denies weight loss.  He has no chest pain or shortness of breath.  He denies any nausea, vomiting, constipation, or diarrhea.  He has no urinary complaints.  Patient feels at his baseline offers no specific complaints today.  REVIEW OF SYSTEMS:   Review of Systems  Constitutional: Negative.  Negative for fever, malaise/fatigue and weight loss.  Respiratory: Negative.  Negative for cough and shortness of breath.   Cardiovascular: Negative.  Negative for chest pain and leg swelling.  Gastrointestinal: Negative.  Negative for abdominal pain, blood in stool and melena.  Genitourinary: Negative.  Negative for dysuria.  Musculoskeletal: Negative.  Negative for back pain.  Skin: Negative.  Negative for rash.  Neurological: Negative.  Negative for sensory change, focal weakness, weakness and headaches.  Psychiatric/Behavioral: Negative.  The patient is not nervous/anxious.     As per HPI. Otherwise, a complete review of systems is negative.  PAST MEDICAL HISTORY: Past Medical History:  Diagnosis Date  . Arthritis   . Hemochromatosis   . Hypertension     PAST SURGICAL HISTORY: Right knee surgery x3, bilateral carpal tunnel surgery.  FAMILY HISTORY: Family History  Problem Relation Age of Onset  . Cirrhosis Mother   . Hemachromatosis Mother   . Coronary artery disease Father   . Dementia  Father   . Prostate cancer Father   . Arthritis Sister   . Pancreatic disease Brother     ADVANCED DIRECTIVES (Y/N):  N  HEALTH MAINTENANCE: Social History   Tobacco Use  . Smoking status: Former Smoker    Packs/day: 2.00    Years: 15.00    Pack years: 30.00    Types: Cigarettes    Last attempt to quit: 03/09/1978    Years since quitting: 39.5  . Smokeless tobacco: Former Neurosurgeon    Types: Chew    Quit date: 03/10/2015  . Tobacco comment: used chew x 2 yr   Substance Use Topics  . Alcohol use: No    Frequency: Never    Comment: socially /rarely  . Drug use: No     Colonoscopy:  PAP:  Bone density:  Lipid panel:  No Known Allergies  Current Outpatient Medications  Medication Sig Dispense Refill  . aspirin EC 81 MG tablet Take 81 mg by mouth daily.    . naproxen sodium (ALEVE) 220 MG tablet Take 220 mg by mouth daily as needed.    . valsartan-hydrochlorothiazide (DIOVAN-HCT) 320-25 MG tablet Take 1 tablet by mouth daily.      No current facility-administered medications for this visit.     OBJECTIVE: Vitals:   09/22/17 1029  BP: 113/80  Pulse: 71  Resp: 20  Temp: 98.5 F (36.9 C)     Body mass index is 33.71 kg/m.    ECOG FS:0 - Asymptomatic  General: Well-developed, well-nourished, no acute distress. Eyes: Pink conjunctiva, anicteric sclera. HEENT: Normocephalic, moist mucous membranes. Lungs: Clear to auscultation bilaterally. Heart: Regular rate and rhythm. No rubs,  murmurs, or gallops. Abdomen: Soft, nontender, nondistended. No organomegaly noted, normoactive bowel sounds. Musculoskeletal: No edema, cyanosis, or clubbing. Neuro: Alert, answering all questions appropriately. Cranial nerves grossly intact. Skin: No rashes or petechiae noted. Psych: Normal affect.   LAB RESULTS:  Lab Results  Component Value Date   NA 139 12/16/2013   K 3.5 12/16/2013   CL 104 12/16/2013   CO2 26 12/16/2013   GLUCOSE 98 12/16/2013   BUN 26 (H) 12/16/2013    CREATININE 1.35 (H) 12/16/2013   CALCIUM 8.6 12/16/2013   PROT 7.3 12/16/2013   ALBUMIN 3.8 12/16/2013   AST 34 12/16/2013   ALT 53 12/16/2013   ALKPHOS 69 12/16/2013   BILITOT 0.5 12/16/2013   GFRNONAA 57 (L) 12/16/2013   GFRAA >60 12/16/2013    Lab Results  Component Value Date   WBC 5.3 04/11/2017   NEUTROABS 2.9 04/11/2017   HGB 16.0 04/11/2017   HCT 45.3 04/11/2017   MCV 88.0 04/11/2017   PLT 243 04/11/2017     STUDIES: No results found.  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  PLAN:    1. 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  Patient's ferritin has decreased to 289 after phlebotomy 2 months ago, but is not at goal of 5200.  Proceed with 500 mL phlebotomy today.  Discussed having phlebotomy completed by [facility], but patient's current work schedule makes this difficult.  Given patient's schedule, he will return to clinic in 3 months with repeat laboratory work and further evaluation.  Patient gets all of his laboratory work at [employer].   I spent a total of 30 minutes face-to-face with the patient of which greater than 50% of the visit was spent in counseling and coordination of care as detailed above.   Patient expressed understanding and was in agreement with this plan. He also understands that He can call clinic at any time with any questions, concerns, or complaints.    Jeralyn Ruths, MD   09/22/2017 12:04 PM

## 2017-09-22 ENCOUNTER — Inpatient Hospital Stay: Payer: BLUE CROSS/BLUE SHIELD | Attending: Oncology | Admitting: Oncology

## 2017-09-22 ENCOUNTER — Inpatient Hospital Stay: Payer: BLUE CROSS/BLUE SHIELD

## 2017-09-22 ENCOUNTER — Encounter: Payer: Self-pay | Admitting: Oncology

## 2017-09-22 DIAGNOSIS — Z87891 Personal history of nicotine dependence: Secondary | ICD-10-CM | POA: Insufficient documentation

## 2017-09-22 DIAGNOSIS — I10 Essential (primary) hypertension: Secondary | ICD-10-CM | POA: Insufficient documentation

## 2017-09-22 NOTE — Patient Instructions (Signed)

## 2017-09-22 NOTE — Progress Notes (Signed)
Patient denies any concerns today.  

## 2017-12-18 NOTE — Progress Notes (Signed)
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  INTERVAL HISTORY: Patient returns to clinic today for routine 1725-month evaluation, laboratory work, and consideration of additional phlebotomy.  He continues to feel well and remains asymptomatic. He has no neurologic complaints.  He denies any recent fevers or illnesses.  He has a good appetite and denies weight loss.  He has no chest pain or shortness of breath.  He denies any nausea, vomiting, constipation, or diarrhea.  He has no urinary complaints.  Patient offers no specific complaints today.  REVIEW OF SYSTEMS:   Review of Systems  Constitutional: Negative.  Negative for fever, malaise/fatigue and weight loss.  Respiratory: Negative.  Negative for cough and shortness of breath.   Cardiovascular: Negative.  Negative for chest pain and leg swelling.  Gastrointestinal: Negative.  Negative for abdominal pain, blood in stool and melena.  Genitourinary: Negative.  Negative for dysuria.  Musculoskeletal: Negative.  Negative for back pain.  Skin: Negative.  Negative for rash.  Neurological: Negative.  Negative for sensory change, focal weakness, weakness and headaches.  Psychiatric/Behavioral: Negative.  The patient is not nervous/anxious.     As per HPI. Otherwise, a complete review of systems is negative.  PAST MEDICAL HISTORY: Past Medical History:  Diagnosis Date  . Arthritis   . Hemochromatosis   . Hypertension     PAST SURGICAL HISTORY: Right knee surgery x3, bilateral carpal tunnel surgery.  FAMILY HISTORY: Family History  Problem Relation Age of Onset  . Cirrhosis Mother   . Hemachromatosis Mother   . Coronary artery disease Father   . Dementia Father   . Prostate  cancer Father   . Arthritis Sister   . Pancreatic disease Brother     ADVANCED DIRECTIVES (Y/N):  N  HEALTH MAINTENANCE: Social History   Tobacco Use  . Smoking status: Former Smoker    Packs/day: 2.00    Years: 15.00    Pack years: 30.00    Types: Cigarettes    Last attempt to quit: 03/09/1978    Years since quitting: 39.8  . Smokeless tobacco: Former NeurosurgeonUser    Types: Chew    Quit date: 03/10/2015  . Tobacco comment: used chew x 2 yr   Substance Use Topics  . Alcohol use: No    Frequency: Never    Comment: socially /rarely  . Drug use: No     Colonoscopy:  PAP:  Bone density:  Lipid panel:  No Known Allergies  Current Outpatient Medications  Medication Sig Dispense Refill  . aspirin EC 81 MG tablet Take 81 mg by mouth daily.    . valsartan-hydrochlorothiazide (DIOVAN-HCT) 320-25 MG tablet Take 1 tablet by mouth daily.      No current facility-administered medications for this visit.     OBJECTIVE: Vitals:   12/22/17 1034  BP: 111/75  Pulse: 77  Temp: (!) 97 F (36.1 C)     Body mass index is 32.69 kg/m.    ECOG FS:0 - Asymptomatic  General: Well-developed, well-nourished, no acute distress. Eyes: Pink conjunctiva, anicteric sclera. HEENT: Normocephalic, moist mucous membranes. Lungs: Clear to auscultation bilaterally. Heart: Regular rate and rhythm. No rubs, murmurs, or gallops. Abdomen: Soft, nontender, nondistended. No organomegaly noted, normoactive bowel sounds. Musculoskeletal: No edema, cyanosis, or clubbing. Neuro: Alert, answering all questions  appropriately. Cranial nerves grossly intact. Skin: No rashes or petechiae noted. Psych: Normal affect.  LAB RESULTS:  Lab Results  Component Value Date   NA 139 12/16/2013   K 3.5 12/16/2013   CL 104 12/16/2013   CO2 26 12/16/2013   GLUCOSE 98 12/16/2013   BUN 26 (H) 12/16/2013   CREATININE 1.35 (H) 12/16/2013   CALCIUM 8.6 12/16/2013   PROT 7.3 12/16/2013   ALBUMIN 3.8 12/16/2013   AST 34  12/16/2013   ALT 53 12/16/2013   ALKPHOS 69 12/16/2013   BILITOT 0.5 12/16/2013   GFRNONAA 57 (L) 12/16/2013   GFRAA >60 12/16/2013    Lab Results  Component Value Date   WBC 5.3 04/11/2017   NEUTROABS 2.9 04/11/2017   HGB 16.0 04/11/2017   HCT 45.3 04/11/2017   MCV 88.0 04/11/2017   PLT 243 04/11/2017     STUDIES: No results found.  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  PLAN:    1. 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  Patient's ferritin level has mildly improved to 207.  Hemoglobin continues to be within normal limits at 16.3.  Goal ferritin will be 50-100, therefore will proceed with 500 mL phlebotomy today.  Discussed having phlebotomy completed by [facility], but patient's current work schedule makes this difficult.  Return to clinic in 3 months for repeat laboratory work, further evaluation, and consideration of additional phlebotomy.  Patient gets all of his laboratory work at [employer].   I spent a total of 20 minutes face-to-face with the patient of which greater than 50% of the visit was spent in counseling and coordination of care as detailed above.  Patient expressed understanding and was in agreement with this plan. He also understands that He can call clinic at any time with any questions, concerns, or complaints.    Jeralyn Ruths, MD   12/24/2017 6:47 AM

## 2017-12-22 ENCOUNTER — Inpatient Hospital Stay: Payer: BLUE CROSS/BLUE SHIELD | Attending: Oncology | Admitting: Oncology

## 2017-12-22 ENCOUNTER — Other Ambulatory Visit: Payer: Self-pay

## 2017-12-22 ENCOUNTER — Inpatient Hospital Stay: Payer: BLUE CROSS/BLUE SHIELD

## 2017-12-22 DIAGNOSIS — Z87891 Personal history of nicotine dependence: Secondary | ICD-10-CM | POA: Diagnosis not present

## 2017-12-22 NOTE — Progress Notes (Signed)
Patient is here to follow up on his hereditary hemochromatosis. Patient stated that he had been doing well.

## 2018-03-19 NOTE — Progress Notes (Signed)
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  INTERVAL HISTORY: Patient returns to clinic today for routine 39-month evaluation and consideration of additional phlebotomy.  He continues to feel well and remains asymptomatic. He has no neurologic complaints.  He denies any recent fevers or illnesses.  He has a good appetite and denies weight loss.  He has no chest pain or shortness of breath.  He denies any nausea, vomiting, constipation, or diarrhea.  He has no urinary complaints.  Patient feels at his baseline offers no specific complaints today.  REVIEW OF SYSTEMS:   Review of Systems  Constitutional: Negative.  Negative for fever, malaise/fatigue and weight loss.  Respiratory: Negative.  Negative for cough and shortness of breath.   Cardiovascular: Negative.  Negative for chest pain and leg swelling.  Gastrointestinal: Negative.  Negative for abdominal pain, blood in stool and melena.  Genitourinary: Negative.  Negative for dysuria.  Musculoskeletal: Negative.  Negative for back pain.  Skin: Negative.  Negative for rash.  Neurological: Negative.  Negative for sensory change, focal weakness, weakness and headaches.  Psychiatric/Behavioral: Negative.  The patient is not nervous/anxious.     As per HPI. Otherwise, a complete review of systems is negative.  PAST MEDICAL HISTORY: Past Medical History:  Diagnosis Date  . Arthritis   . Hemochromatosis   . Hypertension     PAST SURGICAL HISTORY: Right knee surgery x3, bilateral carpal tunnel surgery.  FAMILY HISTORY: Family History  Problem Relation Age of Onset  . Cirrhosis Mother   . Hemachromatosis Mother   . Coronary artery disease Father   . Dementia Father   . Prostate  cancer Father   . Arthritis Sister   . Pancreatic disease Brother     ADVANCED DIRECTIVES (Y/N):  N  HEALTH MAINTENANCE: Social History   Tobacco Use  . Smoking status: Former Smoker    Packs/day: 2.00    Years: 15.00    Pack years: 30.00    Types: Cigarettes    Last attempt to quit: 03/09/1978    Years since quitting: 40.0  . Smokeless tobacco: Former Neurosurgeon    Types: Chew    Quit date: 03/10/2015  . Tobacco comment: used chew x 2 yr   Substance Use Topics  . Alcohol use: No    Frequency: Never    Comment: socially /rarely  . Drug use: No     Colonoscopy:  PAP:  Bone density:  Lipid panel:  No Known Allergies  Current Outpatient Medications  Medication Sig Dispense Refill  . aspirin EC 81 MG tablet Take 81 mg by mouth daily.    . valsartan-hydrochlorothiazide (DIOVAN-HCT) 320-25 MG tablet Take 1 tablet by mouth daily.      No current facility-administered medications for this visit.     OBJECTIVE: Vitals:   03/23/18 1035  BP: 98/66  Pulse: (!) 111  Temp: 98.3 F (36.8 C)     Body mass index is 32.93 kg/m.    ECOG FS:0 - Asymptomatic  General: Well-developed, well-nourished, no acute distress. Eyes: Pink conjunctiva, anicteric sclera. HEENT: Normocephalic, moist mucous membranes. Lungs: Clear to auscultation bilaterally. Heart: Regular rate and rhythm. No rubs, murmurs, or gallops. Abdomen: Soft, nontender, nondistended. No organomegaly noted, normoactive bowel sounds. Musculoskeletal: No edema, cyanosis, or clubbing. Neuro: Alert, answering  all questions appropriately. Cranial nerves grossly intact. Skin: No rashes or petechiae noted. Psych: Normal affect.  LAB RESULTS:  Lab Results  Component Value Date   NA 139 12/16/2013   K 3.5 12/16/2013   CL 104 12/16/2013   CO2 26 12/16/2013   GLUCOSE 98 12/16/2013   BUN 26 (H) 12/16/2013   CREATININE 1.35 (H) 12/16/2013   CALCIUM 8.6 12/16/2013   PROT 7.3 12/16/2013   ALBUMIN 3.8 12/16/2013   AST 34  12/16/2013   ALT 53 12/16/2013   ALKPHOS 69 12/16/2013   BILITOT 0.5 12/16/2013   GFRNONAA 57 (L) 12/16/2013   GFRAA >60 12/16/2013    Lab Results  Component Value Date   WBC 5.3 04/11/2017   NEUTROABS 2.9 04/11/2017   HGB 16.0 04/11/2017   HCT 45.3 04/11/2017   MCV 88.0 04/11/2017   PLT 243 04/11/2017     STUDIES: No results found.  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  PLAN:    1. 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  Patient's ferritin level continues to improve and his hemoglobin is within normal limits.  Proceed with 500 mL phlebotomy today.  Discussed having phlebotomy completed by [facility], but patient's current work schedule makes this difficult.  Return to clinic in 3 months with repeat laboratory, further evaluation, and consideration of additional phlebotomy.  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   I spent a total of 20 minutes face-to-face with the patient of which greater than 50% of the visit was spent in counseling and coordination of care as detailed above.   Patient expressed understanding and was in agreement with this plan. He also understands that He can call clinic at any time with any questions, concerns, or complaints.    Jeralyn Ruths, MD   03/24/2018 3:53 PM

## 2018-03-23 ENCOUNTER — Inpatient Hospital Stay: Payer: BLUE CROSS/BLUE SHIELD

## 2018-03-23 ENCOUNTER — Other Ambulatory Visit: Payer: Self-pay

## 2018-03-23 ENCOUNTER — Inpatient Hospital Stay: Payer: BLUE CROSS/BLUE SHIELD | Attending: Oncology | Admitting: Oncology

## 2018-03-23 ENCOUNTER — Encounter: Payer: Self-pay | Admitting: Oncology

## 2018-03-23 DIAGNOSIS — Z87891 Personal history of nicotine dependence: Secondary | ICD-10-CM

## 2018-03-23 DIAGNOSIS — I10 Essential (primary) hypertension: Secondary | ICD-10-CM | POA: Insufficient documentation

## 2018-03-23 NOTE — Progress Notes (Signed)
Patient is here today to follow up on his Hereditary hemochromatosis, Patient stated that he had been doing well with no complaints.

## 2018-06-15 NOTE — Progress Notes (Deleted)
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  INTERVAL HISTORY: Patient returns to clinic today for routine 6264-month evaluation and consideration of additional phlebotomy.  He continues to feel well and remains asymptomatic. He has no neurologic complaints.  He denies any recent fevers or illnesses.  He has a good appetite and denies weight loss.  He has no chest pain or shortness of breath.  He denies any nausea, vomiting, constipation, or diarrhea.  He has no urinary complaints.  Patient feels at his baseline offers no specific complaints today.  REVIEW OF SYSTEMS:   Review of Systems  Constitutional: Negative.  Negative for fever, malaise/fatigue and weight loss.  Respiratory: Negative.  Negative for cough and shortness of breath.   Cardiovascular: Negative.  Negative for chest pain and leg swelling.  Gastrointestinal: Negative.  Negative for abdominal pain, blood in stool and melena.  Genitourinary: Negative.  Negative for dysuria.  Musculoskeletal: Negative.  Negative for back pain.  Skin: Negative.  Negative for rash.  Neurological: Negative.  Negative for sensory change, focal weakness, weakness and headaches.  Psychiatric/Behavioral: Negative.  The patient is not nervous/anxious.     As per HPI. Otherwise, a complete review of systems is negative.  PAST MEDICAL HISTORY: Past Medical History:  Diagnosis Date  . Arthritis   . Hemochromatosis   . Hypertension     PAST SURGICAL HISTORY: Right knee surgery x3, bilateral carpal tunnel surgery.  FAMILY HISTORY: Family History  Problem Relation Age of Onset  . Cirrhosis Mother   . Hemachromatosis Mother   . Coronary artery disease Father   . Dementia Father   . Prostate  cancer Father   . Arthritis Sister   . Pancreatic disease Brother     ADVANCED DIRECTIVES (Y/N):  N  HEALTH MAINTENANCE: Social History   Tobacco Use  . Smoking status: Former Smoker    Packs/day: 2.00    Years: 15.00    Pack years: 30.00    Types: Cigarettes    Last attempt to quit: 03/09/1978    Years since quitting: 40.2  . Smokeless tobacco: Former NeurosurgeonUser    Types: Chew    Quit date: 03/10/2015  . Tobacco comment: used chew x 2 yr   Substance Use Topics  . Alcohol use: No    Frequency: Never    Comment: socially /rarely  . Drug use: No     Colonoscopy:  PAP:  Bone density:  Lipid panel:  No Known Allergies  Current Outpatient Medications  Medication Sig Dispense Refill  . aspirin EC 81 MG tablet Take 81 mg by mouth daily.    . valsartan-hydrochlorothiazide (DIOVAN-HCT) 320-25 MG tablet Take 1 tablet by mouth daily.      No current facility-administered medications for this visit.     OBJECTIVE: There were no vitals filed for this visit.   There is no height or weight on file to calculate BMI.    ECOG FS:0 - Asymptomatic  General: Well-developed, well-nourished, no acute distress. Eyes: Pink conjunctiva, anicteric sclera. HEENT: Normocephalic, moist mucous membranes. Lungs: Clear to auscultation bilaterally. Heart: Regular rate and rhythm. No rubs, murmurs, or gallops. Abdomen: Soft, nontender, nondistended. No organomegaly noted, normoactive bowel sounds. Musculoskeletal: No edema, cyanosis, or clubbing. Neuro: Alert, answering all questions appropriately. Cranial nerves grossly intact.  Skin: No rashes or petechiae noted. Psych: Normal affect.  LAB RESULTS:  Lab Results  Component Value Date   NA 139 12/16/2013   K 3.5 12/16/2013   CL 104 12/16/2013   CO2 26 12/16/2013   GLUCOSE 98 12/16/2013   BUN 26 (H) 12/16/2013   CREATININE 1.35 (H) 12/16/2013   CALCIUM 8.6 12/16/2013   PROT 7.3 12/16/2013   ALBUMIN 3.8 12/16/2013   AST 34 12/16/2013   ALT  53 12/16/2013   ALKPHOS 69 12/16/2013   BILITOT 0.5 12/16/2013   GFRNONAA 57 (L) 12/16/2013   GFRAA >60 12/16/2013    Lab Results  Component Value Date   WBC 5.3 04/11/2017   NEUTROABS 2.9 04/11/2017   HGB 16.0 04/11/2017   HCT 45.3 04/11/2017   MCV 88.0 04/11/2017   PLT 243 04/11/2017     STUDIES: No results found.  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  PLAN:    1. 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  Patient's ferritin level continues to improve and his hemoglobin is within normal limits.  Proceed with 500 mL phlebotomy today.  Discussed having phlebotomy completed by [facility], but patient's current work schedule makes this difficult.  Return to clinic in 3 months with repeat laboratory, further evaluation, and consideration of additional phlebotomy.  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   I spent a total of 20 minutes face-to-face with the patient of which greater than 50% of the visit was spent in counseling and coordination of care as detailed above.   Patient expressed understanding and was in agreement with this plan. He also understands that He can call clinic at any time with any questions, concerns, or complaints.    Jeralyn Ruths, MD   06/15/2018 1:11 PM

## 2018-06-22 ENCOUNTER — Telehealth: Payer: BLUE CROSS/BLUE SHIELD | Admitting: Oncology

## 2018-06-22 ENCOUNTER — Ambulatory Visit: Payer: BLUE CROSS/BLUE SHIELD | Admitting: Oncology

## 2019-08-09 ENCOUNTER — Ambulatory Visit: Payer: Medicare Other | Attending: Internal Medicine

## 2019-08-09 DIAGNOSIS — Z23 Encounter for immunization: Secondary | ICD-10-CM

## 2019-08-09 NOTE — Progress Notes (Signed)
   Covid-19 Vaccination Clinic  Name:  Shawn Harris    MRN: [id number] DOB: 05/22/1950  08/09/2019  Shawn Harris was observed post Covid-19 immunization for 15 minutes without incident. He was provided with Vaccine Information Sheet and instruction to access the V-Safe system.   Shawn Harris was instructed to call 911 with any severe reactions post vaccine: Marland Kitchen Difficulty breathing  . Swelling of face and throat  . A fast heartbeat  . A bad rash all over body  . Dizziness and weakness   Immunizations Administered    Name Date Dose VIS Date Route   Pfizer COVID-19 Vaccine 08/09/2019  9:16 AM 0.3 mL 03/13/2018 Intramuscular   Manufacturer: [employer], [employer]   Lot: [id number]   NDC: [address]-5

## 2019-08-26 ENCOUNTER — Ambulatory Visit: Payer: Medicare Other

## 2019-11-28 ENCOUNTER — Telehealth: Payer: Self-pay | Admitting: *Deleted

## 2019-11-28 NOTE — Telephone Encounter (Signed)
Wife called asking for appointment for blood to be drawn off and to see doctor. She states he had labs drawn at PCP and that they were to have been faxed to Dr Finnegan to look at and decide if patient needs appointment. Please advise  Care Everywhere Labs History Expand AllCollapse All 12/24 HR URINE [facility] 11/07/2019 IRON /ANEMIA PROFILE [facility] 11/07/2019 Component    Ferritin  Iron  Total Iron Binding Capacity (TIBC)  Transferrin  % Saturation  Component 11/07/2019 06/22/2018 06/13/2018 06/13/2018        Ferritin 146 64 78 --  Iron -- -- -- 165  Total Iron Binding Capacity (TIBC) -- -- -- 413.4  Transferrin -- -- -- 295.3  % Saturation -- -- -- 40                         CBC [facility] 11/07/2019 Component    WBC (White Blood Cell Count)  RBC (Red Blood Cell Count)  Hemoglobin  Hematocrit  MCV (Mean Corpuscular Volume)  MCH (Mean Corpuscular Hemoglobin)  MCHC (Mean Corpuscular Hemoglobin Concentration)  RDW-CV (Red Cell Distribution Width)  MPV (Mean Platelet Volume)  Immature Granulocyte Count  Component 11/07/2019 06/13/2018        WBC (White Blood Cell Count) 5.5 5.9 Load older lab results  RBC (Red Blood Cell Count) 5.27 5.42 Load older lab results  Hemoglobin 16.0 16.4 Load older lab results  Hematocrit 47.9 47.7 Load older lab results  MCV (Mean Corpuscular Volume) 90.9 88.0 Load older lab results  MCH (Mean Corpuscular Hemoglobin) 30.4 30.3 Load older lab results  MCHC (Mean Corpuscular Hemoglobin Concentration) 33.4 34.4 Load older lab results  RDW-CV (Red Cell Distribution Width) 13.5 13.1 Load older lab results  MPV (Mean Platelet Volume) 10.8 11.0 Load older lab results  Immature Granulocyte Count -- 0.01   CHEM PROFILE [facility] 11/07/2019 Component    Glucose  Sodium  Potassium  Chloride  Carbon Dioxide (CO2)  Urea Nitrogen (BUN)  Creatinine   Glomerular Filtration Rate (eGFR), MDRD Estimate  Calcium  AST   ALT   Alk Phos (alkaline Phosphatase)  Albumin  Bilirubin, Total  Protein, Total  A/G Ratio  Component 11/07/2019 06/13/2018        Glucose 95 97 Load older lab results  Sodium 138 136 Load older lab results  Potassium 3.7 3.6 Load older lab results  Chloride 99 96Low   Load older lab results  Carbon Dioxide (CO2) 30.7 27.1 Load older lab results  Urea Nitrogen (BUN) 25 19 Load older lab results  Creatinine 1.0 0.9 Load older lab results  Glomerular Filtration Rate (eGFR), MDRD Estimate 75 84 Load older lab results  Calcium 9.4 9.7 Load older lab results  AST  25 32 Load older lab results  ALT  36 43 Load older lab results  Alk Phos (alkaline Phosphatase) 67 63 Load older lab results  Albumin 4.2 4.3 Load older lab results  Bilirubin, Total 0.8 0.9 Load older lab results  Protein, Total 7.1 7.0 Load older lab results  A/G Ratio 1.4 1.6 Load older lab results    

## 2019-12-10 ENCOUNTER — Ambulatory Visit: Payer: Medicare HMO | Admitting: Oncology

## 2019-12-30 ENCOUNTER — Inpatient Hospital Stay: Payer: Medicare HMO | Attending: Oncology | Admitting: Oncology

## 2019-12-30 ENCOUNTER — Inpatient Hospital Stay: Payer: Medicare HMO

## 2019-12-30 ENCOUNTER — Encounter: Payer: Self-pay | Admitting: Oncology

## 2019-12-30 DIAGNOSIS — Z79899 Other long term (current) drug therapy: Secondary | ICD-10-CM | POA: Insufficient documentation

## 2019-12-30 NOTE — Progress Notes (Signed)
Tolerated phlebotomy today. VSS. No complaints at time of discharge. Ambulatory.

## 2019-12-30 NOTE — Progress Notes (Signed)
Patient denies any concerns today.

## 2019-12-30 NOTE — Progress Notes (Signed)
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  INTERVAL HISTORY: Patient returns to clinic today for routine yearly follow-up and consideration of additional phlebotomy.  Mr. Pickard was last seen in our clinic on 03/23/18.  Patient states some of his appointments were canceled secondary to COVID-19.  He is followed closely by his PCP and has lab work including his ferritin levels completed every few months.  He continues to feel well and remains asymptomatic. He has no neurologic complaints.  He denies any recent fevers or illnesses.  He has a good appetite and denies weight loss.  He has no chest pain or shortness of breath.  He denies any nausea, vomiting, constipation, or diarrhea.  He has no urinary complaints.  Patient feels at his baseline offers no specific complaints today.  REVIEW OF SYSTEMS:   Review of Systems  Constitutional: Negative.  Negative for fever, malaise/fatigue and weight loss.  Respiratory: Negative.  Negative for cough and shortness of breath.   Cardiovascular: Negative.  Negative for chest pain and leg swelling.  Gastrointestinal: Negative.  Negative for abdominal pain, blood in stool and melena.  Genitourinary: Negative.  Negative for dysuria.  Musculoskeletal: Negative.  Negative for back pain.  Skin: Negative.  Negative for rash.  Neurological: Negative.  Negative for sensory change, focal weakness, weakness and headaches.  Psychiatric/Behavioral: Negative.  The patient is not nervous/anxious.     As per HPI. Otherwise, a complete review of systems is negative.  PAST MEDICAL HISTORY: Past Medical History:  Diagnosis Date   Arthritis    Hemochromatosis    Hypertension     PAST SURGICAL HISTORY: Right  knee surgery x3, bilateral carpal tunnel surgery.  FAMILY HISTORY: Family History  Problem Relation Age of Onset   Cirrhosis Mother    Hemachromatosis Mother    Coronary artery disease Father    Dementia Father    Prostate cancer Father    Arthritis Sister    Pancreatic disease Brother     ADVANCED DIRECTIVES (Y/N):  N  HEALTH MAINTENANCE: Social History   Tobacco Use   Smoking status: Former Smoker    Packs/day: 2.00    Years: 15.00    Pack years: 30.00    Types: Cigarettes    Quit date: 03/09/1978    Years since quitting: 41.8   Smokeless tobacco: Former Neurosurgeon    Types: Chew    Quit date: 03/10/2015   Tobacco comment: used chew x 2 yr   Vaping Use   Vaping Use: Never used  Substance Use Topics   Alcohol use: No    Comment: socially /rarely   Drug use: No     Colonoscopy:  PAP:  Bone density:  Lipid panel:  No Known Allergies  Current Outpatient Medications  Medication Sig Dispense Refill   valsartan-hydrochlorothiazide (DIOVAN-HCT) 320-25 MG tablet Take 1 tablet by mouth daily.      No current facility-administered medications for this visit.    OBJECTIVE: Vitals:   12/30/19 1032  BP: 113/87  Pulse: 100  Resp: 20  Temp: 97.7 F (36.5 C)     Body mass index is 33.53 kg/m.    ECOG FS:0 - Asymptomatic  Physical Exam  LAB RESULTS:  Lab Results  Component Value Date   NA 139 12/16/2013  K 3.5 12/16/2013   CL 104 12/16/2013   CO2 26 12/16/2013   GLUCOSE 98 12/16/2013   BUN 26 (H) 12/16/2013   CREATININE 1.35 (H) 12/16/2013   CALCIUM 8.6 12/16/2013   PROT 7.3 12/16/2013   ALBUMIN 3.8 12/16/2013   AST 34 12/16/2013   ALT 53 12/16/2013   ALKPHOS 69 12/16/2013   BILITOT 0.5 12/16/2013   GFRNONAA 57 (L) 12/16/2013   GFRAA >60 12/16/2013    Lab Results  Component Value Date   WBC 5.3 04/11/2017   NEUTROABS 2.9 04/11/2017   HGB 16.0 04/11/2017   HCT 45.3 04/11/2017   MCV 88.0 04/11/2017   PLT 243 04/11/2017      STUDIES: No results found.  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  PLAN:    1. 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 -Patient had several phlebotomies to get his ferritin below 100. -Last phlebotomy was in March 2020- removed given to one blood -Since then his ferritin has stayed below 100. Goal ferritin is between 50-100.  -Patient apparently gets labs completed at [facility]. -Labs from 11/07/19 show hemoglobin of 16, hematocrit 47.9 and ferritin of 146.  -Proceed with 500 mL phlebotomy today. -RTC in 6 weeks with repeat labs (CBC, Ferritin) and possible phlebotomy and in 12 weeks with labs, phlebotomy and MD assessment.  Greater than 50% was spent in counseling and coordination of care with this patient including but not limited to discussion of the relevant topics above (See A&P) including, but not limited to diagnosis and management of acute and chronic medical conditions.    Patient expressed understanding and was in agreement with this plan. He also understands that He can call clinic at any time with any questions, concerns, or complaints.    Mauro Kaufmann, NP   12/30/2019 10:56 AM

## 2020-02-10 ENCOUNTER — Inpatient Hospital Stay: Payer: Medicare HMO

## 2020-02-10 ENCOUNTER — Inpatient Hospital Stay: Payer: Medicare HMO | Attending: Oncology

## 2020-02-16 NOTE — H&P (Signed)
ORTHOPAEDIC HISTORY & PHYSICAL  Billie Trager, Adelina Mings., MD - 02/11/2020 3:15 PM EST Chief Complaint: Chief Complaint  Patient presents with  . Knee Pain  Right knee osteoarthritis, discuss surgery   Reason for Visit: The patient is a 70 y.o. male who presents today with his wife for reevaluation of his right knee. He reports a long history of right knee pain. He localizes most of the pain along the medial aspect of the knee. He reports some swelling, no locking, and some giving way of the knee. The pain is aggravated by any weight bearing but he also has some pain at night. The knee pain limits the patient's ability to ambulate long distances. The patient has not appreciated any significant improvement despite NSAIDs, activity modification, and intra-articular corticosteroid injections. He is not using any ambulatory aids. The patient states that the knee pain has progressed to the point that it is significantly interfering with his activities of daily living.  Medications: Current Outpatient Medications  Medication Sig Dispense Refill  . APPLE CIDER VINEGAR ORAL Take 2 Doses by mouth once daily 2-3 gummies per day  . hydrocortisone (ANUSOL-HC) 2.5 % rectal cream APPLY TOPICALLY THREE TIMES A DAY FOR 10 DAYS 60 g 17  . valsartan-hydrochlorothiazide (DIOVAN-HCT) 160-25 mg tablet TAKE 1 TABLET BY MOUTH EVERY DAY 30 tablet 11   No current facility-administered medications for this visit.   Allergies: No Known Allergies  Past Medical History: Past Medical History:  Diagnosis Date  . Chronic fatigue 06/01/2018  . Hemochromatosis, hereditary (CMS-HCC) 06/01/2018  . Hypertension  . Neuropathy About a year  Dr Orland Jarred feels like this is what I have  . Pain in both lower extremities 06/01/2018  . Screening for colon cancer 06/01/2018  . Vitamin D deficiency 06/01/2018   Past Surgical History: Past Surgical History:  Procedure Laterality Date  . COLONOSCOPY 07/25/2008  Dr. Maggie Font @  [facility] - Int. Hemorrh., Diverticulosis, rpt 10 yrs per RTE Recall ltr mailed  . COLONOSCOPY 10/26/2018  Diverticulosis/Otherwise normal/Repeat 66yrs/TKT  . EGD 07/25/2008  Dr. Maggie Font @ [facility]  . Left knee arthroscopy, medial meniscectomy Left 05/25/2006  Dr. Holli Humbles  . Right knee arthroscopy Right   Social History: Social History   Socioeconomic History  . Marital status: Married  Spouse name: Burna Mortimer  . Number of children: 2  . Years of education: 80  . Highest education level: High school graduate  Occupational History  . Occupation: Retired Advice worker  Tobacco Use  . Smoking status: Former Smoker  Packs/day: 1.50  Years: 15.00  Pack years: 22.50  Types: Cigarettes  Quit date: 1981  Years since quitting: 41.0  . Smokeless tobacco: Never Used  Vaping Use  . Vaping Use: Never used  Substance and Sexual Activity  . Alcohol use: No  Alcohol/week: 0.0 standard drinks  . Drug use: No  . Sexual activity: Yes  Partners: Female  Birth control/protection: None  Other Topics Concern  . Not on file  Social History Narrative  . Not on file   Social Determinants of Health   Financial Resource Strain: Not on file  Food Insecurity: Not on file  Transportation Needs: Not on file  Physical Activity: Not on file  Stress: Not on file  Social Connections: Not on file  Housing Stability: Not on file   Family History: Family History  Problem Relation Age of Onset  . Cirrhosis Mother  non alcoholic  . Liver disease Mother  . Migraines Mother  .  Prostate cancer Father 68  . Dementia Father  . High blood pressure (Hypertension) Father  . Coronary Artery Disease (Blocked arteries around heart) Father  . Myocardial Infarction (Heart attack) Father  . Pulmonary embolism Brother  . High blood pressure (Hypertension) Brother  . Coronary Artery Disease (Blocked arteries around heart) Sister  . Neuropathy Daughter   Review of Systems: A comprehensive 14 point  ROS was performed, reviewed, and the pertinent orthopaedic findings are documented in the HPI.  Exam BP 124/82  Temp 36.7 C (98.1 F)  Ht 185.4 cm (6\' 1" )  Wt (!) 108.9 kg (240 lb)  BMI 31.66 kg/m   General:  Well-developed, well-nourished male seen in no acute distress.  Antalgic gait. Varus thrust to the right knee.  HEENT:  Atraumatic, normocephalic. Pupils are equal and reactive to light. Extraocular motion is intact. Sclera are clear. Oropharynx is clear with moist mucosa.  Neck:  Supple, nontender, and with good ROM. No thyromegaly, adenopathy, JVD, or carotid bruits.  Lungs:  Clear to auscultation bilaterally.  Cardiovascular:  Regular rate and rhythm. Normal S1, S2. No murmur . No appreciable gallops or rubs. Peripheral pulses are palpable. No lower extremity edema. Homan`s test is negative.  Abdomen:  Soft, nontender, nondistended. Bowel sounds are present.  Extremities: Good strength, stability, and range of motion of the upper extremities. Good range of motion of the hips and ankles.  Right Knee: Soft tissue swelling: minimal Effusion: none Erythema: none Crepitance: mild Tenderness: medial Alignment: relative varus Mediolateral laxity: medial pseudolaxity Posterior sag: negative Patellar tracking: Good tracking without evidence of subluxation or tilt Atrophy: No significant atrophy.  Quadriceps tone was fair to good. Range of motion: 0/0/128 degrees  Neurologic:  Awake, alert, and oriented.  Sensory function is intact to pinprick and light touch.  Motor strength is judged to be 5/5.  Motor coordination is within normal limits.  No apparent clonus. No tremor.   X-rays: I reviewed the right knee radiographs that were performed at [facility] on 12/24/2019. There is narrowing of the medial cartilage space with associated varus alignment. Osteophyte formation is noted. Subchondral sclerosis is noted. No evidence of fracture or dislocation.    Impression: Degenerative arthrosis of the right knee  Plan:  The findings were discussed in detail with the patient. The patient was given informational material on total knee replacement. Conservative treatment options were reviewed with the patient. We discussed the risks and benefits of surgical intervention. The usual perioperative course was also discussed in detail. The patient expressed understanding of the risks and benefits of surgical intervention and would like to proceed with plans for right total knee arthroplasty.  We discussed the option of same-day surgery. The patient is a good candidate and he would like to proceed with tentative plans for same-day surgery.  MEDICAL CLEARANCE: Per anesthesiology. ACTIVITY: As tolerated. WORK STATUS: Not applicable. THERAPY: Preoperative physical therapy evaluation. MEDICATIONS: Requested Prescriptions   No prescriptions requested or ordered in this encounter   FOLLOW-UP: Return for preop History & Physical pending surgery date.  Cleon Signorelli P. 14/07/2019., M.D.  This note was generated in part with voice recognition software and I apologize for any typographical errors that were not detected and corrected.   Electronically signed by Angie Fava., MD at 02/13/2020 6:51 AM EST

## 2020-02-18 ENCOUNTER — Other Ambulatory Visit: Payer: Self-pay

## 2020-02-18 ENCOUNTER — Encounter
Admission: RE | Admit: 2020-02-18 | Discharge: 2020-02-18 | Disposition: A | Discharge status: Home / Self Care | Payer: Medicare HMO | Source: Ambulatory Visit | Attending: Orthopedic Surgery | Admitting: Orthopedic Surgery

## 2020-02-18 DIAGNOSIS — Z01818 Encounter for other preprocedural examination: Secondary | ICD-10-CM | POA: Insufficient documentation

## 2020-02-18 LAB — SURGICAL PCR SCREEN
MRSA, PCR: NEGATIVE
Staphylococcus aureus: NEGATIVE

## 2020-02-18 LAB — TYPE AND SCREEN
ABO/RH(D): O POS
Antibody Screen: NEGATIVE

## 2020-02-18 LAB — COMPREHENSIVE METABOLIC PANEL
ALT: 58 U/L — ABNORMAL HIGH (ref 0–44)
AST: 41 U/L (ref 15–41)
Albumin: 4.3 g/dL (ref 3.5–5.0)
Alkaline Phosphatase: 65 U/L (ref 38–126)
Anion gap: 11 (ref 5–15)
BUN: 23 mg/dL (ref 8–23)
CO2: 29 mmol/L (ref 22–32)
Calcium: 9.4 mg/dL (ref 8.9–10.3)
Chloride: 99 mmol/L (ref 98–111)
Creatinine, Ser: 0.94 mg/dL (ref 0.61–1.24)
GFR, Estimated: >60 mL/min (ref >60)
Glucose, Bld: 85 mg/dL (ref 70–99)
Potassium: 3.1 mmol/L — ABNORMAL LOW (ref 3.5–5.1)
Sodium: 139 mmol/L (ref 135–145)
Total Bilirubin: 1.1 mg/dL (ref 0.3–1.2)
Total Protein: 7.9 g/dL (ref 6.5–8.1)

## 2020-02-18 LAB — CBC
HCT: 46.2 % (ref 39.0–52.0)
Hemoglobin: 16 g/dL (ref 13.0–17.0)
MCH: 30.2 pg (ref 26.0–34.0)
MCHC: 34.6 g/dL (ref 30.0–36.0)
MCV: 87.2 fL (ref 80.0–100.0)
Platelets: 274 10*3/uL (ref 150–400)
RBC: 5.3 MIL/uL (ref 4.22–5.81)
RDW: 13.4 % (ref 11.5–15.5)
WBC: 7.3 10*3/uL (ref 4.0–10.5)
nRBC: 0 % (ref 0.0–0.2)

## 2020-02-18 LAB — URINALYSIS, ROUTINE W REFLEX MICROSCOPIC
Bilirubin Urine: NEGATIVE
Glucose, UA: NEGATIVE mg/dL
Hgb urine dipstick: NEGATIVE
Ketones, ur: NEGATIVE mg/dL
Leukocytes,Ua: NEGATIVE
Nitrite: NEGATIVE
Protein, ur: NEGATIVE mg/dL
Specific Gravity, Urine: 1.024 (ref 1.005–1.030)
pH: 5 (ref 5.0–8.0)

## 2020-02-18 LAB — C-REACTIVE PROTEIN: CRP: 1.1 mg/dL — ABNORMAL HIGH (ref <1.0)

## 2020-02-18 LAB — PROTIME-INR
INR: 1 (ref 0.8–1.2)
Prothrombin Time: 12.7 seconds (ref 11.4–15.2)

## 2020-02-18 LAB — APTT: aPTT: 29 seconds (ref 24–36)

## 2020-02-18 LAB — SEDIMENTATION RATE: Sed Rate: 3 mm/hr (ref 0–20)

## 2020-02-18 NOTE — Progress Notes (Signed)
   [facility] Perioperative Services: Pre-Admission/Anesthesia Testing  Abnormal Lab Notification   Date: 02/18/20  Name: Shawn Harris MRN:   [id number]  Re: Abnormal labs noted during PAT appointment   Provider(s) Notified: Donato Heinz, MD  Notification mode: Routed and/or faxed via [facility]   ABNORMAL LAB VALUE(S): Lab Results  Component Value Date   K 3.1 (L) 02/18/2020   Notes:  Patient is scheduled for a COMPUTER ASSISTED TOTAL KNEE ARTHROPLASTY (Right Knee) on 02/21/2020. Patient is on a daily diuretic. Will forward to her surgeon for review and consideration of need for optimization.  Order placed for K+ to be rechecked on the day of surgery.    This is a Personal assistant; no formal response is required.  Quentin Mulling, MSN, APRN, FNP-C, CEN [facility]  Peri-operative Services Nurse Practitioner Phone: [phone] Fax: [phone] 02/18/20 4:37 PM

## 2020-02-18 NOTE — Patient Instructions (Addendum)
Your procedure is scheduled on:  Friday, February 4 Report to the Registration Desk on the 1st floor of the [facility] To find out your arrival time, please call [phone] between 1PM - 3PM on: Thursday, February 3  REMEMBER: Instructions that are not followed completely may result in serious medical risk, up to and including death; or upon the discretion of your surgeon and anesthesiologist your surgery may need to be rescheduled.  Do not eat food after midnight the night before surgery.  No gum chewing, lozengers or hard candies.  You may however, drink CLEAR liquids up to 2 hours before you are scheduled to arrive for your surgery. Do not drink anything within 2 hours of your scheduled arrival time.  Clear liquids include: - water  - apple juice without pulp - gatorade (not RED, PURPLE, OR BLUE) - black coffee or tea (Do NOT add milk or creamers to the coffee or tea) Do NOT drink anything that is not on this list.  In addition, your doctor has ordered for you to drink the provided  Ensure Pre-Surgery Clear Carbohydrate Drink  Drinking this carbohydrate drink up to two hours before surgery helps to reduce insulin resistance and improve patient outcomes. Please complete drinking 2 hours prior to scheduled arrival time.  DO NOT TAKE ANY MEDICATIONS THE MORNING OF SURGERY  One week prior to surgery: Stop Anti-inflammatories (NSAIDS) such as Advil, Aleve, Ibuprofen, Motrin, Naproxen, Naprosyn and Aspirin based products such as Excedrin, Goodys Powder, BC Powder. Stop ANY OVER THE COUNTER supplements until after surgery.  No Alcohol for 24 hours before or after surgery.  No Smoking including e-cigarettes for 24 hours prior to surgery.  No chewable tobacco products for at least 6 hours prior to surgery.  No nicotine patches on the day of surgery.  Do not use any "recreational" drugs for at least a week prior to your surgery.  Please be advised that the combination of cocaine  and anesthesia may have negative outcomes, up to and including death. If you test positive for cocaine, your surgery will be cancelled.  On the morning of surgery brush your teeth with toothpaste and water, you may rinse your mouth with mouthwash if you wish. Do not swallow any toothpaste or mouthwash.  Do not wear jewelry, make-up, hairpins, clips or nail polish.  Do not wear lotions, powders, or perfumes.   Do not shave body from the neck down 48 hours prior to surgery just in case you cut yourself which could leave a site for infection.  Also, freshly shaved skin may become irritated if using the CHG soap.  Contact lenses, hearing aids and dentures may not be worn into surgery.  Do not bring valuables to the hospital. [facility] is not responsible for any missing/lost belongings or valuables.   Use CHG Soap as directed on instruction sheet.  Notify your doctor if there is any change in your medical condition (cold, fever, infection).  Wear comfortable clothing (specific to your surgery type) to the hospital.  Plan for stool softeners for home use; pain medications have a tendency to cause constipation. You can also help prevent constipation by eating foods high in fiber such as fruits and vegetables and drinking plenty of fluids as your diet allows.  After surgery, you can help prevent lung complications by doing breathing exercises.  Take deep breaths and cough every 1-2 hours. Your doctor may order a device called an Incentive Spirometer to help you take deep breaths.  If  you are being discharged the day of surgery, you will not be allowed to drive home. You will need a responsible adult (18 years or older) to drive you home and stay with you that night.   If you are taking public transportation, you will need to have a responsible adult (18 years or older) with you. Please confirm with your physician that it is acceptable to use public transportation.   Please call the  Pre-admissions Testing Dept. at [phone] if you have any questions about these instructions.  Visitation Policy:  Patients undergoing a surgery or procedure may have one family member or support person with them as long as that person is not COVID-19 positive or experiencing its symptoms.  That person may remain in the waiting area during the procedure.

## 2020-02-19 ENCOUNTER — Other Ambulatory Visit: Payer: Self-pay

## 2020-02-19 ENCOUNTER — Other Ambulatory Visit
Admission: RE | Admit: 2020-02-19 | Discharge: 2020-02-19 | Disposition: A | Discharge status: Home / Self Care | Payer: Medicare HMO | Source: Ambulatory Visit | Attending: Orthopedic Surgery | Admitting: Orthopedic Surgery

## 2020-02-19 DIAGNOSIS — Z01812 Encounter for preprocedural laboratory examination: Secondary | ICD-10-CM | POA: Diagnosis present

## 2020-02-19 DIAGNOSIS — Z20822 Contact with and (suspected) exposure to covid-19: Secondary | ICD-10-CM | POA: Diagnosis not present

## 2020-02-19 LAB — SARS CORONAVIRUS 2 (TAT 6-24 HRS): SARS Coronavirus 2: NEGATIVE

## 2020-02-20 LAB — URINE CULTURE: Culture: <10000 — AB

## 2020-02-20 MED ORDER — TRANEXAMIC ACID-NACL 1000-0.7 MG/100ML-% IV SOLN
1000.0000 mg | INTRAVENOUS | Status: AC
  Administered 2020-02-21: 1000 mg via INTRAVENOUS

## 2020-02-20 MED ORDER — FAMOTIDINE 20 MG PO TABS: 20.0000 mg | ORAL_TABLET | Freq: Once | ORAL | Status: AC

## 2020-02-20 MED ORDER — ORAL CARE MOUTH RINSE: 15.0000 mL | Freq: Once | OROMUCOSAL | Status: AC

## 2020-02-20 MED ORDER — CELECOXIB 200 MG PO CAPS: 400.0000 mg | ORAL_CAPSULE | Freq: Once | ORAL | Status: AC

## 2020-02-20 MED ORDER — GABAPENTIN 300 MG PO CAPS: 300.0000 mg | ORAL_CAPSULE | Freq: Once | ORAL | Status: AC

## 2020-02-20 MED ORDER — DEXAMETHASONE SODIUM PHOSPHATE 10 MG/ML IJ SOLN: 8.0000 mg | Freq: Once | INTRAMUSCULAR | Status: AC

## 2020-02-20 MED ORDER — CEFAZOLIN SODIUM-DEXTROSE 2-4 GM/100ML-% IV SOLN
2.0000 g | INTRAVENOUS | Status: AC
  Administered 2020-02-21: 2 g via INTRAVENOUS

## 2020-02-20 MED ORDER — CHLORHEXIDINE GLUCONATE 0.12 % MT SOLN: 15.0000 mL | Freq: Once | OROMUCOSAL | Status: AC

## 2020-02-20 MED ORDER — LACTATED RINGERS IV SOLN: INTRAVENOUS | Status: DC

## 2020-02-20 MED ORDER — CHLORHEXIDINE GLUCONATE 4 % EX LIQD: 60.0000 mL | Freq: Once | CUTANEOUS | Status: DC

## 2020-02-21 ENCOUNTER — Encounter: Payer: Self-pay | Admitting: Orthopedic Surgery

## 2020-02-21 ENCOUNTER — Other Ambulatory Visit: Payer: Self-pay

## 2020-02-21 ENCOUNTER — Ambulatory Visit: Payer: Medicare HMO | Admitting: Urgent Care

## 2020-02-21 ENCOUNTER — Ambulatory Visit
Admission: RE | Admit: 2020-02-21 | Discharge: 2020-02-21 | Disposition: A | Discharge status: Home / Self Care | Payer: Medicare HMO | Attending: Orthopedic Surgery | Admitting: Orthopedic Surgery

## 2020-02-21 ENCOUNTER — Ambulatory Visit: Payer: Medicare HMO

## 2020-02-21 ENCOUNTER — Encounter
Admission: RE | Disposition: A | Discharge status: Home / Self Care | Payer: Self-pay | Source: Home / Self Care | Attending: Orthopedic Surgery

## 2020-02-21 DIAGNOSIS — Z8379 Family history of other diseases of the digestive system: Secondary | ICD-10-CM | POA: Insufficient documentation

## 2020-02-21 DIAGNOSIS — Z820 Family history of epilepsy and other diseases of the nervous system: Secondary | ICD-10-CM | POA: Diagnosis not present

## 2020-02-21 DIAGNOSIS — Z8249 Family history of ischemic heart disease and other diseases of the circulatory system: Secondary | ICD-10-CM | POA: Diagnosis not present

## 2020-02-21 DIAGNOSIS — M1711 Unilateral primary osteoarthritis, right knee: Secondary | ICD-10-CM | POA: Insufficient documentation

## 2020-02-21 DIAGNOSIS — Z96659 Presence of unspecified artificial knee joint: Secondary | ICD-10-CM

## 2020-02-21 DIAGNOSIS — Z79899 Other long term (current) drug therapy: Secondary | ICD-10-CM | POA: Diagnosis not present

## 2020-02-21 DIAGNOSIS — Z8042 Family history of malignant neoplasm of prostate: Secondary | ICD-10-CM | POA: Diagnosis not present

## 2020-02-21 DIAGNOSIS — Z87891 Personal history of nicotine dependence: Secondary | ICD-10-CM | POA: Insufficient documentation

## 2020-02-21 DIAGNOSIS — Z96651 Presence of right artificial knee joint: Secondary | ICD-10-CM

## 2020-02-21 HISTORY — PX: KNEE ARTHROPLASTY: SHX992

## 2020-02-21 LAB — POCT I-STAT, CHEM 8
BUN: 19 mg/dL (ref 8–23)
Calcium, Ion: 1.13 mmol/L — ABNORMAL LOW (ref 1.15–1.40)
Chloride: 100 mmol/L (ref 98–111)
Creatinine, Ser: 0.8 mg/dL (ref 0.61–1.24)
Glucose, Bld: 79 mg/dL (ref 70–99)
HCT: 47 % (ref 39.0–52.0)
Hemoglobin: 16 g/dL (ref 13.0–17.0)
Potassium: 3.2 mmol/L — ABNORMAL LOW (ref 3.5–5.1)
Sodium: 137 mmol/L (ref 135–145)
TCO2: 25 mmol/L (ref 22–32)

## 2020-02-21 LAB — ABO/RH: ABO/RH(D): O POS

## 2020-02-21 SURGERY — ARTHROPLASTY, KNEE, TOTAL, USING IMAGELESS COMPUTER-ASSISTED NAVIGATION
Anesthesia: Spinal | Site: Knee | Laterality: Right

## 2020-02-21 MED ORDER — GLYCOPYRROLATE 0.2 MG/ML IJ SOLN
INTRAMUSCULAR | Status: AC
  Filled 2020-02-21: qty 1

## 2020-02-21 MED ORDER — ALUM & MAG HYDROXIDE-SIMETH 200-200-20 MG/5ML PO SUSP: 30.0000 mL | ORAL | Status: DC | PRN

## 2020-02-21 MED ORDER — CELECOXIB 200 MG PO CAPS
ORAL_CAPSULE | ORAL | Status: AC
  Administered 2020-02-21: 400 mg via ORAL
  Filled 2020-02-21: qty 2

## 2020-02-21 MED ORDER — FERROUS SULFATE 325 (65 FE) MG PO TABS
325.0000 mg | ORAL_TABLET | Freq: Two times a day (BID) | ORAL | Status: DC
  Filled 2020-02-21: qty 1

## 2020-02-21 MED ORDER — LIDOCAINE HCL (PF) 2 % IJ SOLN
INTRAMUSCULAR | Status: AC
  Filled 2020-02-21: qty 5

## 2020-02-21 MED ORDER — EPHEDRINE 5 MG/ML INJ
INTRAVENOUS | Status: AC
  Filled 2020-02-21: qty 10

## 2020-02-21 MED ORDER — PROPOFOL 10 MG/ML IV BOLUS
INTRAVENOUS | Status: AC
  Filled 2020-02-21: qty 20

## 2020-02-21 MED ORDER — CEFAZOLIN SODIUM-DEXTROSE 2-4 GM/100ML-% IV SOLN
INTRAVENOUS | Status: AC
  Administered 2020-02-21: 2 g via INTRAVENOUS
  Filled 2020-02-21: qty 100

## 2020-02-21 MED ORDER — SENNOSIDES-DOCUSATE SODIUM 8.6-50 MG PO TABS
1.0000 | ORAL_TABLET | Freq: Two times a day (BID) | ORAL | Status: DC
  Filled 2020-02-21: qty 1

## 2020-02-21 MED ORDER — PHENYLEPHRINE HCL (PRESSORS) 10 MG/ML IV SOLN
INTRAVENOUS | Status: DC | PRN
  Administered 2020-02-21 (×2): 100 ug via INTRAVENOUS

## 2020-02-21 MED ORDER — BUPIVACAINE HCL (PF) 0.25 % IJ SOLN
INTRAMUSCULAR | Status: DC | PRN
  Administered 2020-02-21: 60 mL

## 2020-02-21 MED ORDER — BISACODYL 10 MG RE SUPP
10.0000 mg | Freq: Every day | RECTAL | Status: DC | PRN
  Filled 2020-02-21: qty 1

## 2020-02-21 MED ORDER — GABAPENTIN 300 MG PO CAPS
ORAL_CAPSULE | ORAL | Status: AC
  Administered 2020-02-21: 300 mg via ORAL
  Filled 2020-02-21: qty 1

## 2020-02-21 MED ORDER — OXYCODONE HCL 5 MG PO TABS: 5.0000 mg | ORAL_TABLET | ORAL | Status: DC | PRN

## 2020-02-21 MED ORDER — LACTATED RINGERS IV BOLUS
500.0000 mL | Freq: Once | INTRAVENOUS | Status: AC
  Administered 2020-02-21: 500 mL via INTRAVENOUS

## 2020-02-21 MED ORDER — TRAMADOL HCL 50 MG PO TABS: 50.0000 mg | ORAL_TABLET | ORAL | Status: DC | PRN

## 2020-02-21 MED ORDER — LIDOCAINE HCL (PF) 2 % IJ SOLN
INTRAMUSCULAR | Status: DC | PRN
  Administered 2020-02-21: 50 mg

## 2020-02-21 MED ORDER — MAGNESIUM HYDROXIDE 400 MG/5ML PO SUSP: 30.0000 mL | Freq: Every day | ORAL | Status: DC

## 2020-02-21 MED ORDER — DEXAMETHASONE SODIUM PHOSPHATE 10 MG/ML IJ SOLN
INTRAMUSCULAR | Status: AC
  Administered 2020-02-21: 8 mg via INTRAVENOUS
  Filled 2020-02-21: qty 1

## 2020-02-21 MED ORDER — ENSURE PRE-SURGERY PO LIQD
296.0000 mL | Freq: Once | ORAL | Status: DC
  Filled 2020-02-21: qty 296

## 2020-02-21 MED ORDER — MENTHOL 3 MG MT LOZG
1.0000 | LOZENGE | OROMUCOSAL | Status: DC | PRN
  Filled 2020-02-21: qty 9

## 2020-02-21 MED ORDER — BUPIVACAINE HCL (PF) 0.5 % IJ SOLN
INTRAMUSCULAR | Status: DC | PRN
  Administered 2020-02-21: 2.7 mL via INTRATHECAL

## 2020-02-21 MED ORDER — DIPHENHYDRAMINE HCL 12.5 MG/5ML PO ELIX
12.5000 mg | ORAL_SOLUTION | ORAL | Status: DC | PRN
  Filled 2020-02-21: qty 10

## 2020-02-21 MED ORDER — TRANEXAMIC ACID-NACL 1000-0.7 MG/100ML-% IV SOLN
INTRAVENOUS | Status: AC
  Filled 2020-02-21: qty 100

## 2020-02-21 MED ORDER — OXYCODONE HCL 5 MG PO TABS: 5.0000 mg | ORAL_TABLET | Freq: Once | ORAL | Status: DC | PRN

## 2020-02-21 MED ORDER — ACETAMINOPHEN 10 MG/ML IV SOLN
INTRAVENOUS | Status: AC
  Filled 2020-02-21: qty 100

## 2020-02-21 MED ORDER — CELECOXIB 200 MG PO CAPS: 200.0000 mg | ORAL_CAPSULE | Freq: Two times a day (BID) | ORAL | Status: DC

## 2020-02-21 MED ORDER — PANTOPRAZOLE SODIUM 40 MG PO TBEC
40.0000 mg | DELAYED_RELEASE_TABLET | Freq: Two times a day (BID) | ORAL | Status: DC
  Filled 2020-02-21: qty 1

## 2020-02-21 MED ORDER — SODIUM CHLORIDE 0.9 % IV SOLN
INTRAVENOUS | Status: DC | PRN
  Administered 2020-02-21: 60 mL

## 2020-02-21 MED ORDER — METOCLOPRAMIDE HCL 10 MG PO TABS: 10.0000 mg | ORAL_TABLET | Freq: Three times a day (TID) | ORAL | Status: DC

## 2020-02-21 MED ORDER — CHLORHEXIDINE GLUCONATE 0.12 % MT SOLN
OROMUCOSAL | Status: AC
  Administered 2020-02-21: 15 mL via OROMUCOSAL
  Filled 2020-02-21: qty 15

## 2020-02-21 MED ORDER — ACETAMINOPHEN 325 MG PO TABS: 325.0000 mg | ORAL_TABLET | Freq: Four times a day (QID) | ORAL | Status: DC | PRN

## 2020-02-21 MED ORDER — CELECOXIB 200 MG PO CAPS
ORAL_CAPSULE | ORAL | Status: AC
  Administered 2020-02-21: 200 mg via ORAL
  Filled 2020-02-21: qty 1

## 2020-02-21 MED ORDER — SODIUM CHLORIDE 0.9 % IV SOLN: INTRAVENOUS | Status: DC

## 2020-02-21 MED ORDER — HYDROMORPHONE HCL 1 MG/ML IJ SOLN: 0.5000 mg | INTRAMUSCULAR | Status: DC | PRN

## 2020-02-21 MED ORDER — PROPOFOL 500 MG/50ML IV EMUL
INTRAVENOUS | Status: AC
  Filled 2020-02-21: qty 50

## 2020-02-21 MED ORDER — PROPOFOL 500 MG/50ML IV EMUL
INTRAVENOUS | Status: DC | PRN
  Administered 2020-02-21: 35 ug/kg/min via INTRAVENOUS

## 2020-02-21 MED ORDER — SURGIPHOR WOUND IRRIGATION SYSTEM - OPTIME
TOPICAL | Status: DC | PRN
  Administered 2020-02-21: 1

## 2020-02-21 MED ORDER — MIDAZOLAM HCL 5 MG/5ML IJ SOLN
INTRAMUSCULAR | Status: DC | PRN
  Administered 2020-02-21 (×2): 1 mg via INTRAVENOUS
  Administered 2020-02-21: 2 mg via INTRAVENOUS

## 2020-02-21 MED ORDER — CEFAZOLIN SODIUM-DEXTROSE 2-4 GM/100ML-% IV SOLN: 2.0000 g | Freq: Four times a day (QID) | INTRAVENOUS | Status: DC

## 2020-02-21 MED ORDER — ENOXAPARIN SODIUM 40 MG/0.4ML ~~LOC~~ SOLN: 40.0000 mg | SUBCUTANEOUS | Status: DC

## 2020-02-21 MED ORDER — FENTANYL CITRATE (PF) 100 MCG/2ML IJ SOLN: 25.0000 ug | INTRAMUSCULAR | Status: DC | PRN

## 2020-02-21 MED ORDER — ACETAMINOPHEN 10 MG/ML IV SOLN
INTRAVENOUS | Status: DC | PRN
  Administered 2020-02-21: 1000 mg via INTRAVENOUS

## 2020-02-21 MED ORDER — ACETAMINOPHEN 10 MG/ML IV SOLN: 1000.0000 mg | Freq: Four times a day (QID) | INTRAVENOUS | Status: DC

## 2020-02-21 MED ORDER — FENTANYL CITRATE (PF) 100 MCG/2ML IJ SOLN
INTRAMUSCULAR | Status: DC | PRN
  Administered 2020-02-21 (×2): 50 ug via INTRAVENOUS

## 2020-02-21 MED ORDER — MIDAZOLAM HCL 2 MG/2ML IJ SOLN
INTRAMUSCULAR | Status: AC
  Filled 2020-02-21: qty 2

## 2020-02-21 MED ORDER — TRANEXAMIC ACID-NACL 1000-0.7 MG/100ML-% IV SOLN
INTRAVENOUS | Status: AC
  Administered 2020-02-21: 1000 mg via INTRAVENOUS
  Filled 2020-02-21: qty 100

## 2020-02-21 MED ORDER — ONDANSETRON HCL 4 MG PO TABS: 4.0000 mg | ORAL_TABLET | Freq: Four times a day (QID) | ORAL | Status: DC | PRN

## 2020-02-21 MED ORDER — ONDANSETRON HCL 4 MG/2ML IJ SOLN: 4.0000 mg | Freq: Four times a day (QID) | INTRAMUSCULAR | Status: DC | PRN

## 2020-02-21 MED ORDER — FENTANYL CITRATE (PF) 100 MCG/2ML IJ SOLN
INTRAMUSCULAR | Status: AC
  Filled 2020-02-21: qty 2

## 2020-02-21 MED ORDER — ENOXAPARIN SODIUM 40 MG/0.4ML ~~LOC~~ SOLN: 40.0000 mg | SUBCUTANEOUS | 0 refills | Status: DC

## 2020-02-21 MED ORDER — DEXMEDETOMIDINE (PRECEDEX) IN NS 20 MCG/5ML (4 MCG/ML) IV SYRINGE
PREFILLED_SYRINGE | INTRAVENOUS | Status: DC | PRN
  Administered 2020-02-21: 12 ug via INTRAVENOUS
  Administered 2020-02-21: 8 ug via INTRAVENOUS

## 2020-02-21 MED ORDER — OXYCODONE HCL 5 MG PO TABS: 5.0000 mg | ORAL_TABLET | ORAL | 0 refills | Status: DC | PRN

## 2020-02-21 MED ORDER — TRANEXAMIC ACID-NACL 1000-0.7 MG/100ML-% IV SOLN: 1000.0000 mg | Freq: Once | INTRAVENOUS | Status: AC

## 2020-02-21 MED ORDER — SODIUM CHLORIDE 0.9 % IR SOLN
Status: DC | PRN
  Administered 2020-02-21: 500 mL

## 2020-02-21 MED ORDER — GLYCOPYRROLATE 0.2 MG/ML IJ SOLN
INTRAMUSCULAR | Status: DC | PRN
  Administered 2020-02-21: .2 mg via INTRAVENOUS

## 2020-02-21 MED ORDER — FLEET ENEMA 7-19 GM/118ML RE ENEM: 1.0000 | ENEMA | Freq: Once | RECTAL | Status: DC | PRN

## 2020-02-21 MED ORDER — CEFAZOLIN SODIUM-DEXTROSE 2-4 GM/100ML-% IV SOLN
INTRAVENOUS | Status: AC
  Filled 2020-02-21: qty 100

## 2020-02-21 MED ORDER — FAMOTIDINE 20 MG PO TABS
ORAL_TABLET | ORAL | Status: AC
  Administered 2020-02-21: 20 mg via ORAL
  Filled 2020-02-21: qty 1

## 2020-02-21 MED ORDER — LACTATED RINGERS IV BOLUS
250.0000 mL | Freq: Once | INTRAVENOUS | Status: AC
  Administered 2020-02-21: 250 mL via INTRAVENOUS

## 2020-02-21 MED ORDER — PHENOL 1.4 % MT LIQD
1.0000 | OROMUCOSAL | Status: DC | PRN
  Filled 2020-02-21: qty 177

## 2020-02-21 MED ORDER — OXYCODONE HCL 5 MG/5ML PO SOLN: 5.0000 mg | Freq: Once | ORAL | Status: DC | PRN

## 2020-02-21 MED ORDER — CELECOXIB 200 MG PO CAPS: 200.0000 mg | ORAL_CAPSULE | Freq: Two times a day (BID) | ORAL | 2 refills | Status: DC

## 2020-02-21 MED ORDER — BUPIVACAINE HCL (PF) 0.5 % IJ SOLN
INTRAMUSCULAR | Status: AC
  Filled 2020-02-21: qty 10

## 2020-02-21 MED ORDER — EPHEDRINE SULFATE 50 MG/ML IJ SOLN
INTRAMUSCULAR | Status: DC | PRN
  Administered 2020-02-21 (×2): 10 mg via INTRAVENOUS

## 2020-02-21 SURGICAL SUPPLY — 79 items
ATTUNE MED DOME PAT 41 KNEE (Knees) ×1 IMPLANT
ATTUNE PS FEM RT SZ 8 CEM KNEE (Femur) ×1 IMPLANT
ATTUNE PSRP INSR SZ8 6 KNEE (Insert) ×1 IMPLANT
BASE TIBIAL ATTUNE KNEE SZ9 (Knees) IMPLANT
BATTERY INSTRU NAVIGATION (MISCELLANEOUS) ×8 IMPLANT
BLADE SAW 70X12.5 (BLADE) ×2 IMPLANT
BLADE SAW 90X13X1.19 OSCILLAT (BLADE) ×2 IMPLANT
BLADE SAW 90X25X1.19 OSCILLAT (BLADE) ×2 IMPLANT
BSPLAT TIB 9 CMNT ROT PLAT STR (Knees) ×1 IMPLANT
BTRY SRG DRVR LF (MISCELLANEOUS) ×4
CANISTER PREVENA PLUS 150 (CANNISTER) ×2 IMPLANT
CANISTER SUCT 3000ML PPV (MISCELLANEOUS) ×2 IMPLANT
CEMENT HV SMART SET (Cement) ×1 IMPLANT
COOLER POLAR GLACIER W/PUMP (MISCELLANEOUS) ×2 IMPLANT
COVER WAND RF STERILE (DRAPES) ×2 IMPLANT
CUFF TOURN SGL QUICK 24 (TOURNIQUET CUFF)
CUFF TOURN SGL QUICK 30 (TOURNIQUET CUFF)
CUFF TRNQT CYL 24X4X16.5-23 (TOURNIQUET CUFF) IMPLANT
CUFF TRNQT CYL 30X4X21-28X (TOURNIQUET CUFF) IMPLANT
DRAPE 3/4 80X56 (DRAPES) ×2 IMPLANT
DRSG DERMACEA 8X12 NADH (GAUZE/BANDAGES/DRESSINGS) ×2 IMPLANT
DRSG MEPILEX SACRM 8.7X9.8 (GAUZE/BANDAGES/DRESSINGS) ×2 IMPLANT
DRSG OPSITE POSTOP 4X14 (GAUZE/BANDAGES/DRESSINGS) ×2 IMPLANT
DRSG TEGADERM 4X4.75 (GAUZE/BANDAGES/DRESSINGS) ×2 IMPLANT
DURAPREP 26ML APPLICATOR (WOUND CARE) ×4 IMPLANT
ELECT REM PT RETURN 9FT ADLT (ELECTROSURGICAL) ×2
ELECTRODE REM PT RTRN 9FT ADLT (ELECTROSURGICAL) ×1 IMPLANT
EX-PIN ORTHOLOCK NAV 4X150 (PIN) ×4 IMPLANT
GLOVE BIO SURGEON STRL SZ7.5 (GLOVE) ×4 IMPLANT
GLOVE BIOGEL PI IND STRL 7.5 (GLOVE) ×1 IMPLANT
GLOVE BIOGEL PI INDICATOR 7.5 (GLOVE) ×1
GLOVE INDICATOR 8.0 STRL GRN (GLOVE) ×2 IMPLANT
GLOVE SURG ENC TEXT LTX SZ7.5 (GLOVE) ×4 IMPLANT
GOWN STRL REUS W/ TWL LRG LVL3 (GOWN DISPOSABLE) ×2 IMPLANT
GOWN STRL REUS W/ TWL XL LVL3 (GOWN DISPOSABLE) ×1 IMPLANT
GOWN STRL REUS W/TWL LRG LVL3 (GOWN DISPOSABLE) ×4
GOWN STRL REUS W/TWL XL LVL3 (GOWN DISPOSABLE) ×2
HEMOVAC 400CC 10FR (MISCELLANEOUS) ×2 IMPLANT
HOLDER FOLEY CATH W/STRAP (MISCELLANEOUS) ×2 IMPLANT
HOOD PEEL AWAY FLYTE STAYCOOL (MISCELLANEOUS) ×4 IMPLANT
IRRIGATION SURGIPHOR STRL (IV SOLUTION) ×2 IMPLANT
KIT PREVENA INCISION MGT20CM45 (CANNISTER) ×2 IMPLANT
KIT PUMP PREVENA PLUS 14DAY (MISCELLANEOUS) ×2 IMPLANT
KIT TURNOVER KIT A (KITS) ×2 IMPLANT
KNIFE SCULPS 14X20 (INSTRUMENTS) ×2 IMPLANT
LABEL OR SOLS (LABEL) ×2 IMPLANT
MANIFOLD NEPTUNE II (INSTRUMENTS) ×4 IMPLANT
NDL SAFETY ECLIPSE 18X1.5 (NEEDLE) ×1 IMPLANT
NDL SPNL 20GX3.5 QUINCKE YW (NEEDLE) ×2 IMPLANT
NEEDLE HYPO 18GX1.5 SHARP (NEEDLE) ×2
NEEDLE SPNL 20GX3.5 QUINCKE YW (NEEDLE) ×4 IMPLANT
NS IRRIG 500ML POUR BTL (IV SOLUTION) ×2 IMPLANT
PACK TOTAL KNEE (MISCELLANEOUS) ×2 IMPLANT
PAD WRAPON POLAR KNEE (MISCELLANEOUS) ×1 IMPLANT
PENCIL SMOKE ULTRAEVAC 22 CON (MISCELLANEOUS) ×2 IMPLANT
PIN FIXATION 1/8DIA X 3INL (PIN) ×6 IMPLANT
PULSAVAC PLUS IRRIG FAN TIP (DISPOSABLE) ×2
SOL .9 NS 3000ML IRR  AL (IV SOLUTION) ×1
SOL .9 NS 3000ML IRR AL (IV SOLUTION) ×1
SOL .9 NS 3000ML IRR UROMATIC (IV SOLUTION) ×1 IMPLANT
SOL PREP PVP 2OZ (MISCELLANEOUS) ×2
SOLUTION PREP PVP 2OZ (MISCELLANEOUS) ×1 IMPLANT
SPONGE DRAIN TRACH 4X4 STRL 2S (GAUZE/BANDAGES/DRESSINGS) ×2 IMPLANT
STAPLER SKIN PROX 35W (STAPLE) ×2 IMPLANT
STOCKINETTE IMPERV 14X48 (MISCELLANEOUS) IMPLANT
STRAP TIBIA SHORT (MISCELLANEOUS) ×2 IMPLANT
SUCTION FRAZIER HANDLE 10FR (MISCELLANEOUS) ×1
SUCTION TUBE FRAZIER 10FR DISP (MISCELLANEOUS) ×1 IMPLANT
SUT VIC AB 0 CT1 36 (SUTURE) ×4 IMPLANT
SUT VIC AB 1 CT1 36 (SUTURE) ×4 IMPLANT
SUT VIC AB 2-0 CT2 27 (SUTURE) ×2 IMPLANT
SYR 20ML LL LF (SYRINGE) ×2 IMPLANT
SYR 30ML LL (SYRINGE) ×4 IMPLANT
TIBIAL BASE ATTUNE KNEE SZ9 (Knees) ×2 IMPLANT
TIP FAN IRRIG PULSAVAC PLUS (DISPOSABLE) ×1 IMPLANT
TOWEL OR 17X26 4PK STRL BLUE (TOWEL DISPOSABLE) ×2 IMPLANT
TOWER CARTRIDGE SMART MIX (DISPOSABLE) ×2 IMPLANT
TRAY FOLEY MTR SLVR 16FR STAT (SET/KITS/TRAYS/PACK) ×2 IMPLANT
WRAPON POLAR PAD KNEE (MISCELLANEOUS) ×2

## 2020-02-21 NOTE — OR Nursing (Signed)
PT in for evaluation @ 1430.

## 2020-02-21 NOTE — Op Note (Signed)
OPERATIVE NOTE  DATE OF SURGERY:  02/21/2020  PATIENT NAME:  Shawn Harris   DOB: 1950-05-06  MRN: [id number]  PRE-OPERATIVE DIAGNOSIS: Degenerative arthrosis of the right knee, primary  POST-OPERATIVE DIAGNOSIS:  Same  PROCEDURE:  Right total knee arthroplasty using computer-assisted navigation  SURGEON:  Jena Gauss. M.D.  ASSISTANT: Baldwin Jamaica, PA-C (present and scrubbed throughout the case, critical for assistance with exposure, retraction, instrumentation, and closure)  ANESTHESIA: spinal  ESTIMATED BLOOD LOSS: 50 mL  FLUIDS REPLACED: 1700 mL of crystalloid  TOURNIQUET TIME: 122 minutes  DRAINS: Wound VAC  SOFT TISSUE RELEASES: Anterior cruciate ligament, posterior cruciate ligament, deep and superficial medial collateral ligament, patellofemoral ligament  IMPLANTS UTILIZED: DePuy Attune size 8 posterior stabilized femoral component (cemented), size 9 rotating platform tibial component (cemented), 41 mm medialized dome patella (cemented), and a 6 mm stabilized rotating platform polyethylene insert.  INDICATIONS FOR SURGERY: Shawn Harris is a 70 y.o. year old male with a long history of progressive knee pain. X-rays demonstrated severe degenerative changes in tricompartmental fashion. The patient had not seen any significant improvement despite conservative nonsurgical intervention. After discussion of the risks and benefits of surgical intervention, the patient expressed understanding of the risks benefits and agree with plans for total knee arthroplasty.   The risks, benefits, and alternatives were discussed at length including but not limited to the risks of infection, bleeding, nerve injury, stiffness, blood clots, the need for revision surgery, cardiopulmonary complications, among others, and they were willing to proceed.  PROCEDURE IN DETAIL: The patient was brought into the operating room and, after adequate spinal anesthesia was achieved, a tourniquet was placed  on the patient's upper thigh. The patient's knee and leg were cleaned and prepped with alcohol and DuraPrep and draped in the usual sterile fashion. A "timeout" was performed as per usual protocol. The lower extremity was exsanguinated using an Esmarch, and the tourniquet was inflated to 300 mmHg. An anterior longitudinal incision was made followed by a standard mid vastus approach. The deep fibers of the medial collateral ligament were elevated in a subperiosteal fashion off of the medial flare of the tibia so as to maintain a continuous soft tissue sleeve. The patella was subluxed laterally and the patellofemoral ligament was incised. Inspection of the knee demonstrated severe degenerative changes with full-thickness loss of articular cartilage. Osteophytes were debrided using a rongeur. Anterior and posterior cruciate ligaments were excised. Two 4.0 mm Schanz pins were inserted in the femur and into the tibia for attachment of the array of trackers used for computer-assisted navigation. Hip center was identified using a circumduction technique. Distal landmarks were mapped using the computer. The distal femur and proximal tibia were mapped using the computer. The distal femoral cutting guide was positioned using computer-assisted navigation so as to achieve a 5 distal valgus cut. The femur was sized and it was felt that a size 8 femoral component was appropriate. A size 8 femoral cutting guide was positioned and the anterior cut was performed and verified using the computer. This was followed by completion of the posterior and chamfer cuts. Femoral cutting guide for the central box was then positioned in the center box cut was performed.  Attention was then directed to the proximal tibia. Medial and lateral menisci were excised. The extramedullary tibial cutting guide was positioned using computer-assisted navigation so as to achieve a 0 varus-valgus alignment and 3 posterior slope. The cut was performed and  verified using the computer. The proximal tibia was  sized and it was felt that a size 9 tibial tray was appropriate. Tibial and femoral trials were inserted followed by insertion of a 6 mm polyethylene insert. The knee was felt to be tight medially. A Cobb elevator was used to elevate the superficial fibers of the medial collateral ligament.  This allowed for excellent mediolateral soft tissue balancing both in flexion and in full extension. Finally, the patella was cut and prepared so as to accommodate a 41 mm medialized dome patella. A patella trial was placed and the knee was placed through a range of motion with excellent patellar tracking appreciated. The femoral trial was removed after debridement of posterior osteophytes. The central post-hole for the tibial component was reamed followed by insertion of a keel punch. Tibial trials were then removed. Cut surfaces of bone were irrigated with copious amounts of normal saline using pulsatile lavage and then suctioned dry. Polymethylmethacrylate cement was prepared in the usual fashion using a vacuum mixer. Cement was applied to the cut surface of the proximal tibia as well as along the undersurface of a size 9 rotating platform tibial component. Tibial component was positioned and impacted into place. Excess cement was removed using Personal assistant. Cement was then applied to the cut surfaces of the femur as well as along the posterior flanges of the size 8 femoral component. The femoral component was positioned and impacted into place. Excess cement was removed using Personal assistant. A 6 mm polyethylene trial was inserted and the knee was brought into full extension with steady axial compression applied. Finally, cement was applied to the backside of a 41 mm medialized dome patella and the patellar component was positioned and patellar clamp applied. Excess cement was removed using Personal assistant. After adequate curing of the cement, the tourniquet was deflated  after a total tourniquet time of 122 minutes. Hemostasis was achieved using electrocautery. The knee was irrigated with copious amounts of normal saline using pulsatile lavage followed by 500 ml of Surgiphor and then suctioned dry. 20 mL of 1.3% Exparel and 60 mL of 0.25% Marcaine in 40 mL of normal saline was injected along the posterior capsule, medial and lateral gutters, and along the arthrotomy site. A 6 mm stabilized rotating platform polyethylene insert was inserted and the knee was placed through a range of motion with excellent mediolateral soft tissue balancing appreciated and excellent patellar tracking noted. 2 medium drains were placed in the wound bed and brought out through separate stab incisions. The medial parapatellar portion of the incision was reapproximated using interrupted sutures of #1 Vicryl. Subcutaneous tissue was approximated in layers using first #0 Vicryl followed #2-0 Vicryl. The skin was approximated with skin staples. A sterile dressing was applied.  The patient tolerated the procedure well and was transported to the recovery room in stable condition.    Linell Meldrum P. Angie Fava., M.D.

## 2020-02-21 NOTE — Evaluation (Signed)
Physical Therapy Evaluation Patient Details Name: Shawn Harris MRN: [id number] DOB: 1950-03-15 Today's Date: 02/21/2020   History of Present Illness  Patient is a 70 yo male s/p TKA, WBAT. PMH of smoking, HTN.  Clinical Impression  Patient was alert, in bed with RN and wife at bedside. Reported 4/10 knee pain, able to demonstrate ankle pumps, heel slides without complaints. Exercises verbally reviewed with pt, but session focused on mobility and stair navigation. Pt reported he lives in a RV and his wife will be available 24/7 to assist as needed.  Supine to sit with supervision and HOB elevated. Good sitting balance noted. Pt attempted to urinate in sitting and then in standing, fair balance noted with static standing. RW and CGA for initial sit <> stand with verbal cues for hand placement, pt able to perform several times and exhibited supervision with good carry over of education. He ambulated ~158ft with RW CGA with reciprocal gait pattern. Instructed in step to gait pattern as needed for pain management. Pt did have some occasional hyper extension noted in R knee, pt did endorse that he could feel it (but some of his foot was still numb). PT encouraged heel strike and intentional quad activation to improve knee stability with ambulation and stairs.   Stairs performed twice, second bout with improved safety and great carryover. PT and pt also reviewed importance of TKE at rest, use of bone foam, and car transfers.  Overall the patient demonstrated deficits (see "PT Problem List") that impede the patient's functional abilities, safety, and mobility and would benefit from skilled PT intervention. Recommendation is HHPT with supervion/assistance 24/7, family and pt without further questions at this time.      Follow Up Recommendations Home health PT;Supervision/Assistance - 24 hour    Equipment Recommendations  None recommended by PT;Other (comment) (pt has RW and BSC at home)     Recommendations for Other Services       Precautions / Restrictions Precautions Precautions: Fall;Knee Precaution Booklet Issued: Yes (comment) Restrictions Weight Bearing Restrictions: Yes RLE Weight Bearing: Weight bearing as tolerated      Mobility  Bed Mobility Overal bed mobility: Needs Assistance Bed Mobility: Supine to Sit     Supine to sit: Supervision;HOB elevated          Transfers Overall transfer level: Needs assistance Equipment used: Rolling walker (2 wheeled) Transfers: Sit to/from Stand Sit to Stand: Supervision;Min guard         General transfer comment: initially CGA with cueing for hand placement, progressed to supervision and good carry over of education  Ambulation/Gait Ambulation/Gait assistance: Min guard;Supervision Gait Distance (Feet): 160 Feet Assistive device: Rolling walker (2 wheeled)       General Gait Details: some occasional hyper extension noted in R knee, pt did endorse that he could feel it. encouraged heel strike and intentional quad activation to improve knee stability  Stairs Stairs: Yes Stairs assistance: Min guard Stair Management: Step to pattern;Forwards;One rail Left Number of Stairs: 4 (4 steps, twice) General stair comments: one instance of knee buckling. educated on precautions and use of rail, second bout of stair navigation no unsteadiness or buckling noted  Wheelchair Mobility    Modified Rankin (Stroke Patients Only)       Balance Overall balance assessment: Needs assistance Sitting-balance support: Feet supported Sitting balance-Leahy Scale: Good       Standing balance-Leahy Scale: Fair  Pertinent Vitals/Pain Pain Assessment: 0-10 Pain Score: 4  Pain Location: R knee Pain Descriptors / Indicators: Aching;Sore Pain Intervention(s): Limited activity within patient's tolerance;Premedicated before session;Monitored during session;Repositioned    Home  Living Family/patient expects to be discharged to:: Private residence (RV) Living Arrangements: Spouse/significant other Available Help at Discharge: Family;Available 24 hours/day Type of Home: Other(Comment) (RV\) Home Access: Stairs to enter Entrance Stairs-Rails: Left Entrance Stairs-Number of Steps: 5 Home Layout: One level;Other (Comment) (2 steps to bedroom with bilateral rails) Home Equipment: Walker - 2 wheels;Bedside commode      Prior Function Level of Independence: Independent               Hand Dominance   Dominant Hand: Right    Extremity/Trunk Assessment   Upper Extremity Assessment Upper Extremity Assessment: Overall WFL for tasks assessed    Lower Extremity Assessment Lower Extremity Assessment: RLE deficits/detail;LLE deficits/detail RLE Deficits / Details: s/p TKA LLE Deficits / Details: WFLs       Communication   Communication: No difficulties  Cognition Arousal/Alertness: Awake/alert Behavior During Therapy: WFL for tasks assessed/performed Overall Cognitive Status: Within Functional Limits for tasks assessed                                        General Comments      Exercises Other Exercises Other Exercises: session focused on functional mobility but pt able to ankle pump, heel slide, LAQ, and march. 0-90deg motion noted   Assessment/Plan    PT Assessment Patient needs continued PT services  PT Problem List Decreased strength;Decreased mobility;Decreased range of motion;Decreased activity tolerance;Decreased balance;Pain;Decreased knowledge of use of DME;Decreased knowledge of precautions       PT Treatment Interventions DME instruction;Therapeutic exercise;Gait training;Balance training;Stair training;Neuromuscular re-education;Functional mobility training;Therapeutic activities;Patient/family education    PT Goals (Current goals can be found in the Care Plan section)  Acute Rehab PT Goals Patient Stated Goal: to  go home PT Goal Formulation: With patient Time For Goal Achievement: 03/06/20 Potential to Achieve Goals: Good    Frequency BID   Barriers to discharge        Co-evaluation               AM-PAC PT "6 Clicks" Mobility  Outcome Measure Help needed turning from your back to your side while in a flat bed without using bedrails?: None Help needed moving from lying on your back to sitting on the side of a flat bed without using bedrails?: None Help needed moving to and from a bed to a chair (including a wheelchair)?: None Help needed standing up from a chair using your arms (e.g., wheelchair or bedside chair)?: None Help needed to walk in hospital room?: None Help needed climbing 3-5 steps with a railing? : A Little 6 Click Score: 23    End of Session Equipment Utilized During Treatment: Gait belt Activity Tolerance: Patient tolerated treatment well Patient left: in chair;with family/visitor present (with OT at bedside) Nurse Communication: Mobility status PT Visit Diagnosis: Other abnormalities of gait and mobility (R26.89);Muscle weakness (generalized) (M62.81);Difficulty in walking, not elsewhere classified (R26.2);Pain Pain - Right/Left: Right Pain - part of body: Knee    Time: 2774-1287 PT Time Calculation (min) (ACUTE ONLY): 38 min   Charges:   PT Evaluation $PT Eval Low Complexity: 1 Low PT Treatments $Therapeutic Exercise: 23-37 mins        Olga Coaster PT, DPT  4:00 PM,02/21/20

## 2020-02-21 NOTE — Progress Notes (Signed)
Patient has orders for HHPT, 3 in 1, and RW.  Spoke to [place] with [facility] who confirmed Surgeon's Office has already arranged HHPT for patient through [facility].  Spoke to RN who reported patient says he already has all DME he needs at home.  Alfonso Ramus, [place] [phone]

## 2020-02-21 NOTE — OR Nursing (Signed)
Per Dr. Ernest Pine, secure-chat, pt to go home with wound vac cannister that was intact from OR room, not the smaller one in the provena bag.

## 2020-02-21 NOTE — H&P (Signed)
The patient has been re-examined, and the chart reviewed, and there have been no interval changes to the documented history and physical.    The risks, benefits, and alternatives have been discussed at length. The patient expressed understanding of the risks benefits and agreed with plans for surgical intervention.  Milagro Belmares P. Altonio Schwertner, Jr. M.D.    

## 2020-02-21 NOTE — OR Nursing (Signed)
OT in for evaluation @ 15:05.

## 2020-02-21 NOTE — Discharge Instructions (Addendum)
Instructions after Total Knee Replacement   Shawn Harris., M.D.     Dept. of [facility]  [place] [place], [place]  [address]  Phone: [phone]   Fax: [phone]    DIET:  Drink plenty of non-alcoholic fluids.  Resume your normal diet. Include foods high in fiber.  ACTIVITY:   You may use crutches or a walker with weight-bearing as tolerated, unless instructed otherwise.  You may be weaned off of the walker or crutches by your Physical Therapist.   Do NOT place pillows under the knee. Anything placed under the knee could limit your ability to straighten the knee.    Continue doing gentle exercises. Exercising will reduce the pain and swelling, increase motion, and prevent muscle weakness.    Please continue to use the TED compression stockings for 6 weeks. You may remove the stockings at night, but should reapply them in the morning.  Do not drive or operate any equipment until instructed.  WOUND CARE:   Continue to use the PolarCare or ice packs periodically to reduce pain and swelling.  You may bathe or shower after the staples are removed at the first office visit following surgery.  MEDICATIONS:  You may resume your regular medications.  Please take the pain medication as prescribed on the medication.  Do not take pain medication on an empty stomach.  You have been given a prescription for a blood thinner (Lovenox or Coumadin). Please take the medication as instructed. (NOTE: After completing a 2 week course of Lovenox, take one Enteric-coated aspirin once a day. This along with elevation will help reduce the possibility of phlebitis in your operated leg.)  Do not drive or drink alcoholic beverages when taking pain medications.  CALL THE OFFICE FOR:  Temperature above 101 degrees  Excessive bleeding or drainage on the dressing.  Excessive swelling, coldness, or paleness of the toes.  Persistent  nausea and vomiting.  FOLLOW-UP:   You should have an appointment to return to the office in 10-14 days after surgery.  Arrangements have been made for continuation of Physical Therapy (either home therapy or outpatient therapy).    [facility] Department Directory         [email or web address]       [email or web address]          Cardiology  Appointments: [place] [phone]  Endocrinology  Appointments: [place] [phone] [place] [phone]  Gastroenterology  Appointments: [place] [phone] [place] [phone]        General Surgery   Appointments: [facility]  Internal Medicine/Family Medicine  Appointments: [place] [facility] [place] - [phone] [place] [phone]  Metabolic and [place] Loss Surgery  Appointments: [facility]        Neurology  Appointments: [place] [phone] [place] - [phone]  Neurosurgery  Appointments: [place]  Obstetrics & Gynecology  Appointments: [place] [phone] [place] - [phone]        Pediatrics  Appointments: [place] [phone] [place] - [phone]  Physiatry  Appointments: [place] [phone]  Physical Therapy  Appointments: [place] [phone]        Podiatry  Appointments: [place] [phone] [place] - [phone]  Pulmonology  Appointments: [place]  Rheumatology  Appointments: [place] [phone]        [place] Location: [facility]  [place], [place]  [address]  [place] Location: [facility] [address] °[place], [place]  [address]  [place] Location: °[facility] °[address] °[place], [place]  [address]  °  ° °Bupivacaine Liposomal Suspension for Injection   WEAR GREEN BRACELET FOR 3 DAYS °What is this medicine? °BUPIVACAINE LIPOSOMAL (bue PIV  a kane LIP oh som al) is an anesthetic. It causes loss of feeling in the skin or other tissues. It is used to prevent and to treat pain from some procedures. °This medicine may be used for other purposes; ask your health care provider or pharmacist if you have questions. °COMMON BRAND NAME(S): EXPAREL °What should I tell my health care provider before I take this medicine? °They need to know if you have any of these conditions: °· G6PD deficiency °· heart disease °· kidney disease °· liver disease °· low blood pressure °· lung or breathing disease, like asthma °· an unusual or allergic reaction to bupivacaine, other medicines, foods, dyes, or preservatives °· pregnant or trying to get pregnant °· breast-feeding °How should I use this medicine? °This medicine is injected into the affected area. It is given by a health care provider in a hospital or clinic setting. °Talk to your health care provider about the use of this medicine in children. While it may be given to children as young as 6 years for selected conditions, precautions do apply. °Overdosage: If you think you have taken too much of this medicine contact a poison control center or emergency room at once. °NOTE: This medicine is only for you. Do not share this medicine with others. °What if I miss a dose? °This does not apply. °What may interact with this medicine? °This medicine may interact with the following medications: °· acetaminophen °· certain antibiotics like dapsone, nitrofurantoin, aminosalicylic acid, sulfonamides °· certain medicines for seizures like phenobarbital, phenytoin, valproic acid °· chloroquine °· cyclophosphamide °· flutamide °· hydroxyurea °· ifosfamide °· metoclopramide °· nitric oxide °· nitroglycerin °· nitroprusside °· nitrous oxide °· other local anesthetics like lidocaine, pramoxine, tetracaine °· primaquine °· quinine °· rasburicase °· sulfasalazine °This list may not describe all possible interactions. Give your health care  provider a list of all the medicines, herbs, non-prescription drugs, or dietary supplements you use. Also tell them if you smoke, drink alcohol, or use illegal drugs. Some items may interact with your medicine. °What should I watch for while using this medicine? °Your condition will be monitored carefully while you are receiving this medicine. °Be careful to avoid injury while the area is numb, and you are not aware of pain. °What side effects may I notice from receiving this medicine? °Side effects that you should report to your doctor or health care professional as soon as possible: °· allergic reactions like skin rash, itching or hives, swelling of the face, lips, or tongue °· seizures °· signs and symptoms of a dangerous change in heartbeat or heart rhythm like chest pain; dizziness; fast, irregular heartbeat; palpitations; feeling faint or lightheaded; falls; breathing problems °· signs and symptoms of methemoglobinemia such as pale, gray, or blue colored skin; headache; fast heartbeat; shortness of breath; feeling faint or lightheaded, falls; tiredness °Side effects that usually do not require medical attention (report to your doctor or health care professional if they continue or are bothersome): °· anxious °· back pain °· changes in taste °· changes in vision °· constipation °· dizziness °· fever °· nausea, vomiting °This list may not describe all possible side effects. Call your doctor for medical advice about side effects. You may report side effects   to FDA at 1-800-FDA-1088. °Where should I keep my medicine? °This drug is given in a hospital or clinic and will not be stored at home. °NOTE: This sheet is a summary. It may not cover all possible information. If you have questions about this medicine, talk to your doctor, pharmacist, or health care provider. °© 2021 Elsevier/Gold Standard (2019-04-11 12:24:57) ° ° ° °AMBULATORY SURGERY  °DISCHARGE INSTRUCTIONS ° ° °1) The drugs that you were given will stay  in your system until tomorrow so for the next 24 hours you should not: ° °A) Drive an automobile °B) Make any legal decisions °C) Drink any alcoholic beverage ° ° °2) You may resume regular meals tomorrow.  Today it is better to start with liquids and gradually work up to solid foods. ° °You may eat anything you prefer, but it is better to start with liquids, then soup and crackers, and gradually work up to solid foods. ° ° °3) Please notify your doctor immediately if you have any unusual bleeding, trouble breathing, redness and pain at the surgery site, drainage, fever, or pain not relieved by medication. ° ° ° °4) Additional Instructions: ° ° ° ° ° ° ° °Please contact your physician with any problems or Same Day Surgery at [phone], Monday through Friday 6 am to 4 pm, or [place] [facility] [place] Main number at [phone]. °

## 2020-02-21 NOTE — Transfer of Care (Signed)
Immediate Anesthesia Transfer of Care Note  Patient: Shawn Harris  Procedure(s) Performed: COMPUTER ASSISTED TOTAL KNEE ARTHROPLASTY (Right Knee)  Patient Location: PACU  Anesthesia Type:Spinal  Level of Consciousness: awake, alert  and oriented  Airway & Oxygen Therapy: Patient Spontanous Breathing  Post-op Assessment: Report given to RN and Post -op Vital signs reviewed and stable  Post vital signs: Reviewed  Last Vitals:  Vitals Value Taken Time  BP    Temp    Pulse    Resp    SpO2      Last Pain:  Vitals:   02/21/20 0613  TempSrc: Temporal  PainSc: 3          Complications: No complications documented.

## 2020-02-21 NOTE — Anesthesia Preprocedure Evaluation (Signed)
Anesthesia Evaluation  Patient identified by MRN, date of birth, ID band Patient awake    Reviewed: Allergy & Precautions, H&P , NPO status , Patient's Chart, lab work & pertinent test results  Airway Mallampati: III  TM Distance: >3 FB Neck ROM: full    Dental  (+) Missing   Pulmonary neg pulmonary ROS, neg shortness of breath, former smoker,    Pulmonary exam normal        Cardiovascular Exercise Tolerance: Good hypertension, (-) angina(-) Past MI and (-) DOE Normal cardiovascular exam     Neuro/Psych negative neurological ROS  negative psych ROS   GI/Hepatic negative GI ROS, Neg liver ROS,   Endo/Other  negative endocrine ROS  Renal/GU      Musculoskeletal  (+) Arthritis ,   Abdominal   Peds  Hematology negative hematology ROS (+)   Anesthesia Other Findings Past Medical History: No date: Arthritis 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 No date: Hypertension  Past Surgical History: 1970 s: CARPAL TUNNEL RELEASE     Comment:  bilateral 2010, 2020: COLONOSCOPY 1980's x3 surg: KNEE SURGERY; Bilateral     Comment:  bilateral knee surg-torn meniscus  BMI    Body Mass Index: 31.46 kg/m      Reproductive/Obstetrics negative OB ROS                             Anesthesia Physical Anesthesia Plan  ASA: II  Anesthesia Plan: Spinal   Post-op Pain Management:    Induction:   PONV Risk Score and Plan:   Airway Management Planned: Natural Airway and Nasal Cannula  Additional Equipment:   Intra-op Plan:   Post-operative Plan:   Informed Consent: I have reviewed the patients History and Physical, chart, labs and discussed the procedure including the risks, benefits and alternatives for the proposed anesthesia with the patient or authorized representative who has indicated his/her understanding and acceptance.     Dental Advisory Given  Plan Discussed with: Anesthesiologist, CRNA and  Surgeon  Anesthesia Plan Comments: (Patient reports no bleeding problems and no anticoagulant use.  Plan for spinal with backup GA  Patient consented for risks of anesthesia including but not limited to:  - adverse reactions to medications - damage to eyes, teeth, lips or other oral mucosa - nerve damage due to positioning  - risk of bleeding, infection and or nerve damage from spinal that could lead to paralysis - risk of headache or failed spinal - damage to teeth, lips or other oral mucosa - sore throat or hoarseness - damage to heart, brain, nerves, lungs, other parts of body or loss of life  Patient voiced understanding.)        Anesthesia Quick Evaluation

## 2020-02-21 NOTE — OR Nursing (Addendum)
Dr. Ernest Pine in to see pt in postop@ 16:24.  Discharge pending transportation.

## 2020-02-21 NOTE — Anesthesia Procedure Notes (Signed)
Spinal ° °Patient location during procedure: OR °Staffing °Performed: resident/CRNA  °Anesthesiologist: Piscitello, Joseph K, MD °Resident/CRNA: Tony Friscia, CRNA °Preanesthetic Checklist °Completed: patient identified, IV checked, site marked, risks and benefits discussed, surgical consent, monitors and equipment checked, pre-op evaluation and timeout performed °Spinal Block °Patient position: sitting °Prep: ChloraPrep and site prepped and draped °Patient monitoring: heart rate, continuous pulse ox, blood pressure and cardiac monitor °Approach: midline °Location: L4-5 °Injection technique: single-shot °Needle °Needle type: Introducer and Pencan  °Needle gauge: 24 G °Needle length: 9 cm °Additional Notes °Negative paresthesia. Negative blood return. Positive free-flowing CSF. Expiration date of kit checked and confirmed. Patient tolerated procedure well, without complications. ° ° ° ° ° ° °

## 2020-02-21 NOTE — Evaluation (Signed)
Occupational Therapy Evaluation Patient Details Name: Shawn Harris MRN: [id number] DOB: 02/15/1950 Today's Date: 02/21/2020    History of Present Illness Patient is a 70 yo male s/p TKA, WBAT. PMH of smoking, HTN.   Clinical Impression   Pt seen for OT evaluation this date, POD#1 from above surgery. Pt was independent in all ADL prior to surgery and is eager to return to PLOF with less pain and improved safety and independence. Pt currently requires PRN minimal assist for LB dressing and bathing while in seated position due to pain and limited AROM of R knee. Pt/spouse instructed in polar care mgt, falls prevention strategies, home/routines modifications, DME/AE for LB bathing and dressing tasks, and compression stocking mgt. Handout provided to support recall and carryover. Pt/spouse verbalized understanding of all education and training provided. No additional skilled OT needs at this time. Pt/spouse in agreement. Do not currently anticipate any OT needs following this hospitalization.      Follow Up Recommendations  No OT follow up    Equipment Recommendations  None recommended by OT    Recommendations for Other Services       Precautions / Restrictions Precautions Precautions: Fall;Knee Precaution Booklet Issued: Yes (comment) Restrictions Weight Bearing Restrictions: Yes RLE Weight Bearing: Weight bearing as tolerated      Mobility Bed Mobility                 Transfers Overall transfer level: Needs assistance Equipment used: Rolling walker (2 wheeled) Transfers: Sit to/from Stand Sit to Stand: Supervision;Min guard         General transfer comment: initially CGA with cueing for hand placement, progressed to supervision and good carry over of education    Balance Overall balance assessment: Needs assistance Sitting-balance support: Feet supported Sitting balance-Leahy Scale: Good     Standing balance support: Bilateral upper extremity  supported Standing balance-Leahy Scale: Fair                             ADL either performed or assessed with clinical judgement   ADL Overall ADL's : Needs assistance/impaired                                       General ADL Comments: MIN A for LB ADL tasks, MIN-MOD A for compression stocknig mgt, polar care mgt; spouse able to provide assist at home     Vision Patient Visual Report: No change from baseline       Perception     Praxis      Pertinent Vitals/Pain Pain Assessment: 0-10 Pain Score: 4  Pain Location: R knee Pain Descriptors / Indicators: Aching;Sore Pain Intervention(s): Limited activity within patient's tolerance;Monitored during session;Premedicated before session;Repositioned;Ice applied     Hand Dominance Right   Extremity/Trunk Assessment Upper Extremity Assessment Upper Extremity Assessment: Overall WFL for tasks assessed   Lower Extremity Assessment Lower Extremity Assessment: RLE deficits/detail RLE Deficits / Details: s/p TKA LLE Deficits / Details: WFLs       Communication Communication Communication: No difficulties   Cognition Arousal/Alertness: Awake/alert Behavior During Therapy: WFL for tasks assessed/performed Overall Cognitive Status: Within Functional Limits for tasks assessed  General Comments       Exercises Other Exercises: Pt/spouse instructed in polar care mgt, compression stocknig mgt, AE/DME, self care skills, home/routines modifications, and falls prevention - handout provided   Shoulder Instructions      Home Living Family/patient expects to be discharged to:: Private residence (RV) Living Arrangements: Spouse/significant other Available Help at Discharge: Family;Available 24 hours/day Type of Home: Other(Comment) (RV\) Home Access: Stairs to enter Entrance Stairs-Number of Steps: 5 Entrance Stairs-Rails: Left Home Layout: One  level;Other (Comment) (2 steps to bedroom with bilateral rails)     Bathroom Shower/Tub: Walk-in shower   Bathroom Toilet: Handicapped height     Home Equipment: Environmental consultant - 2 wheels;Bedside commode;Shower seat;Adaptive equipment Adaptive Equipment: Reacher        Prior Functioning/Environment Level of Independence: Independent                 OT Problem List: Decreased strength;Decreased range of motion;Pain      OT Treatment/Interventions:      OT Goals(Current goals can be found in the care plan section) Acute Rehab OT Goals Patient Stated Goal: to go home OT Goal Formulation: All assessment and education complete, DC therapy  OT Frequency:     Barriers to D/C:            Co-evaluation              AM-PAC OT "6 Clicks" Daily Activity     Outcome Measure Help from another person eating meals?: None Help from another person taking care of personal grooming?: None Help from another person toileting, which includes using toliet, bedpan, or urinal?: A Little Help from another person bathing (including washing, rinsing, drying)?: A Little Help from another person to put on and taking off regular upper body clothing?: None Help from another person to put on and taking off regular lower body clothing?: A Little 6 Click Score: 21   End of Session    Activity Tolerance: Patient tolerated treatment well Patient left: in chair;with call bell/phone within reach;with nursing/sitter in room;with family/visitor present;Other (comment) (hemovac and polar care in place)  OT Visit Diagnosis: Other abnormalities of gait and mobility (R26.89);Pain Pain - Right/Left: Right Pain - part of body: Knee                Time: 2951-8841 OT Time Calculation (min): 13 min Charges:  OT General Charges $OT Visit: 1 Visit OT Evaluation $OT Eval Low Complexity: 1 Low OT Treatments $Self Care/Home Management : 8-22 mins  Richrd Prime, MPH, MS, OTR/L ascom [phone] 02/21/20,  4:55 PM

## 2020-02-23 NOTE — Anesthesia Postprocedure Evaluation (Addendum)
Anesthesia Post Note  Patient: Shawn Harris  Procedure(s) Performed: COMPUTER ASSISTED TOTAL KNEE ARTHROPLASTY (Right Knee)  Patient location during evaluation: Phase II Anesthesia Type: Spinal Level of consciousness: oriented and awake and alert Pain management: pain level controlled Vital Signs Assessment: post-procedure vital signs reviewed and stable Respiratory status: spontaneous breathing Cardiovascular status: blood pressure returned to baseline and stable Postop Assessment: no headache, no backache, no apparent nausea or vomiting and patient able to bend at knees Anesthetic complications: no   No complications documented.   Last Vitals:  Vitals:   02/21/20 1524 02/21/20 1635  BP: 128/81 115/75  Pulse: 84 88  Resp: 16 18  Temp: (!) 36.2 C   SpO2: 97% 95%    Last Pain:  Vitals:   02/22/20 1021  TempSrc:   PainSc: 0-No pain                 Cleda Mccreedy Wojciech Willetts

## 2020-02-24 ENCOUNTER — Encounter: Payer: Self-pay | Admitting: Orthopedic Surgery

## 2020-02-26 ENCOUNTER — Telehealth: Payer: Self-pay | Admitting: Oncology

## 2020-02-26 NOTE — Telephone Encounter (Signed)
Pt's wife called to cancel appts on 03/19/20. Pt had a knee replacement and is unsure of when he will be able to rs. Wife stated they will call back when ready.

## 2020-03-19 ENCOUNTER — Other Ambulatory Visit: Payer: Medicare HMO

## 2020-03-19 ENCOUNTER — Ambulatory Visit: Payer: Medicare HMO | Admitting: Oncology

## 2020-07-27 ENCOUNTER — Telehealth: Payer: Self-pay | Admitting: Oncology

## 2020-07-27 NOTE — Telephone Encounter (Signed)
Patients wife left message requesting to reschedule his lab/md/phlebotomy appointment from March 2022.  Appointments rescheduled and patient confirmed.

## 2020-08-07 ENCOUNTER — Other Ambulatory Visit: Payer: Self-pay | Admitting: Oncology

## 2020-08-10 NOTE — Progress Notes (Signed)
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  INTERVAL HISTORY: Patient returns to clinic today for routine 32-month evaluation and consideration of phlebotomy.  He is now retired.  He continues to feel well and remains asymptomatic.  He has no neurologic complaints.  He denies any recent fevers or illnesses.  He has a good appetite and denies weight loss.  He has no chest pain or shortness of breath.  He denies any nausea, vomiting, constipation, or diarrhea.  He has no urinary complaints.  Patient feels at his baseline offers no specific complaints today.  REVIEW OF SYSTEMS:   Review of Systems  Constitutional: Negative.  Negative for fever, malaise/fatigue and weight loss.  Respiratory: Negative.  Negative for cough and shortness of breath.   Cardiovascular: Negative.  Negative for chest pain and leg swelling.  Gastrointestinal: Negative.  Negative for abdominal pain, blood in stool and melena.  Genitourinary: Negative.  Negative for dysuria.  Musculoskeletal: Negative.  Negative for back pain.  Skin: Negative.  Negative for rash.  Neurological: Negative.  Negative for sensory change, focal weakness, weakness and headaches.  Psychiatric/Behavioral: Negative.  The patient is not nervous/anxious.    As per HPI. Otherwise, a complete review of systems is negative.  PAST MEDICAL HISTORY: Past Medical History:  Diagnosis Date   Arthritis    Hemochromatosis    Hypertension     PAST SURGICAL HISTORY: Right knee surgery x3, bilateral carpal tunnel surgery.  FAMILY HISTORY: Family History  Problem Relation Age of Onset   Cirrhosis Mother    Hemachromatosis Mother    Coronary artery disease Father    Dementia Father    Prostate  cancer Father    Arthritis Sister    Pancreatic disease Brother     ADVANCED DIRECTIVES (Y/N):  N  HEALTH MAINTENANCE: Social History   Tobacco Use   Smoking status: Former    Packs/day: 2.00    Years: 15.00    Pack years: 30.00    Types: Cigarettes    Quit date: 03/09/1978    Years since quitting: 42.4   Smokeless tobacco: Former    Types: Chew    Quit date: 03/10/2015   Tobacco comments:    used chew x 2 yr   Vaping Use   Vaping Use: Never used  Substance Use Topics   Alcohol use: No    Comment: socially /rarely   Drug use: No     Colonoscopy:  PAP:  Bone density:  Lipid panel:  No Known Allergies  Current Outpatient Medications  Medication Sig Dispense Refill   valsartan-hydrochlorothiazide (DIOVAN-HCT) 160-25 MG tablet Take 1 tablet by mouth daily.     APPLE CIDER VINEGAR PO Take 2 tablets by mouth daily.     celecoxib (CELEBREX) 200 MG capsule Take 1 capsule (200 mg total) by mouth 2 (two) times daily. 60 capsule 2   enoxaparin (LOVENOX) 40 MG/0.4ML injection Inject 0.4 mLs (40 mg total) into the skin daily. 5.6 mL 0   oxyCODONE (ROXICODONE) 5 MG immediate release tablet Take 1-2 tablets (5-10 mg total) by mouth every 4 (four) hours as needed for severe pain. 30 tablet 0   No current facility-administered medications for this visit.    OBJECTIVE: Vitals:   08/11/20 0857  BP: 131/80  Pulse: Marland Kitchen)  56  Resp: 16  Temp: (!) 96.5 F (35.8 C)     Body mass index is 31.48 kg/m.    ECOG FS:0 - Asymptomatic  General: Well-developed, well-nourished, no acute distress. Eyes: Pink conjunctiva, anicteric sclera. HEENT: Normocephalic, moist mucous membranes. Lungs: No audible wheezing or coughing. Heart: Regular rate and rhythm. Abdomen: Soft, nontender, no obvious distention. Musculoskeletal: No edema, cyanosis, or clubbing. Neuro: Alert, answering all questions appropriately. Cranial nerves grossly intact. Skin: No rashes or petechiae noted. Psych: Normal  affect.   LAB RESULTS:  Lab Results  Component Value Date   NA 137 02/21/2020   K 3.2 (L) 02/21/2020   CL 100 02/21/2020   CO2 29 02/18/2020   GLUCOSE 79 02/21/2020   BUN 19 02/21/2020   CREATININE 0.80 02/21/2020   CALCIUM 9.4 02/18/2020   PROT 7.9 02/18/2020   ALBUMIN 4.3 02/18/2020   AST 41 02/18/2020   ALT 58 (H) 02/18/2020   ALKPHOS 65 02/18/2020   BILITOT 1.1 02/18/2020   GFRNONAA >60 02/18/2020   GFRAA >60 12/16/2013    Lab Results  Component Value Date   WBC 5.7 08/11/2020   NEUTROABS 3.3 08/11/2020   HGB 14.1 08/11/2020   HCT 41.4 08/11/2020   MCV 87.5 08/11/2020   PLT 268 08/11/2020   Lab Results  Component Value Date   IRON 77 08/11/2020   TIBC 340 08/11/2020   IRONPCTSAT 23 08/11/2020   Lab Results  Component Value Date   FERRITIN 113 08/11/2020      STUDIES: No results found.  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  PLAN:    1. 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  Patient's hemoglobin and iron stores continue to be within normal limits.  His ferritin is above goal of 50-100 at 113.  Proceed with 500 mL phlebotomy today.  Now the patient is retired, he is considering donating blood on a regular basis.  No further intervention is needed.  Return to clinic in 6 months with repeat laboratory, further evaluation, and consideration of phlebotomy if needed.  I spent a total of 30 minutes reviewing chart data, face-to-face evaluation with the patient, counseling and coordination of care as detailed above.   Patient expressed understanding and was in agreement with this plan. He also understands that He can call clinic at any time with any questions, concerns, or complaints.    Jeralyn Ruths, MD   08/12/2020 7:52 AM

## 2020-08-11 ENCOUNTER — Inpatient Hospital Stay: Payer: Medicare HMO

## 2020-08-11 ENCOUNTER — Inpatient Hospital Stay: Payer: Medicare HMO | Attending: Oncology

## 2020-08-11 ENCOUNTER — Inpatient Hospital Stay (HOSPITAL_BASED_OUTPATIENT_CLINIC_OR_DEPARTMENT_OTHER): Payer: Medicare HMO | Admitting: Oncology

## 2020-08-11 ENCOUNTER — Encounter: Payer: Self-pay | Admitting: Oncology

## 2020-08-11 ENCOUNTER — Other Ambulatory Visit: Payer: Self-pay

## 2020-08-11 DIAGNOSIS — Z79899 Other long term (current) drug therapy: Secondary | ICD-10-CM | POA: Insufficient documentation

## 2020-08-11 LAB — CBC WITH DIFFERENTIAL/PLATELET
Abs Immature Granulocytes: 0.02 10*3/uL (ref 0.00–0.07)
Basophils Absolute: 0 10*3/uL (ref 0.0–0.1)
Basophils Relative: 1 %
Eosinophils Absolute: 0.1 10*3/uL (ref 0.0–0.5)
Eosinophils Relative: 2 %
HCT: 41.4 % (ref 39.0–52.0)
Hemoglobin: 14.1 g/dL (ref 13.0–17.0)
Immature Granulocytes: 0 %
Lymphocytes Relative: 31 %
Lymphs Abs: 1.8 10*3/uL (ref 0.7–4.0)
MCH: 29.8 pg (ref 26.0–34.0)
MCHC: 34.1 g/dL (ref 30.0–36.0)
MCV: 87.5 fL (ref 80.0–100.0)
Monocytes Absolute: 0.5 10*3/uL (ref 0.1–1.0)
Monocytes Relative: 9 %
Neutro Abs: 3.3 10*3/uL (ref 1.7–7.7)
Neutrophils Relative %: 57 %
Platelets: 268 10*3/uL (ref 150–400)
RBC: 4.73 MIL/uL (ref 4.22–5.81)
RDW: 14.8 % (ref 11.5–15.5)
WBC: 5.7 10*3/uL (ref 4.0–10.5)
nRBC: 0 % (ref 0.0–0.2)

## 2020-08-11 LAB — IRON AND TIBC
Iron: 77 ug/dL (ref 45–182)
Saturation Ratios: 23 % (ref 17.9–39.5)
TIBC: 340 ug/dL (ref 250–450)
UIBC: 263 ug/dL

## 2020-08-11 LAB — FERRITIN: Ferritin: 113 ng/mL (ref 24–336)

## 2020-08-11 NOTE — Progress Notes (Signed)
Pt received 500 ml phlebotomy. Tolerated procedure well. Pt had a drink prior to discharge. VSS. Discharged to home.

## 2020-08-11 NOTE — Progress Notes (Signed)
Patient denies new problems/concerns today.   °

## 2020-08-11 NOTE — Patient Instructions (Signed)

## 2020-08-12 ENCOUNTER — Encounter: Payer: Self-pay | Admitting: Oncology

## 2021-05-13 NOTE — Progress Notes (Addendum)
Anesthesia Review: ? ?PCP: DR Jerl Mina LOV 03/10/21 called and LVMM to [facility] and requested clearance  ?Clearance  ?Cardiologist : none  ?Chest x-ray : ?EKG :05/17/21  ?Echo : ?Stress test: ?Cardiac Cath :  ?Activity level: can do ao flight of stiars without difficulty  ?Sleep Study/ CPAP : none  ?Fasting Blood Sugar :      / Checks Blood Sugar -- times a day:   ?Blood Thinner/ Instructions /Last Dose: ?ASA / Instructions/ Last Dose :   ?EMail sent to [facility]- [facility] injfected Joint on 05/14/21 in regards to PICC line insertion.  Email on chart  ?CBC done 05/17/21 routed to Dr Charlann Boxer., Shanda Bumps [facility] aware on 05/17/21 hgb- 17.8  ?Have sent 3rd email .  No response as of 05/17/21 pm to emals for PICC linei placement from [facility] Infected joint.  Charge nurse made aware.  Attempted to call IV team to 3 phone numbers with no answer.   ?

## 2021-05-17 ENCOUNTER — Encounter (HOSPITAL_COMMUNITY)
Admission: RE | Admit: 2021-05-17 | Discharge: 2021-05-17 | Disposition: A | Discharge status: Home / Self Care | Payer: Medicare HMO | Source: Ambulatory Visit | Attending: Orthopedic Surgery | Admitting: Orthopedic Surgery

## 2021-05-17 ENCOUNTER — Other Ambulatory Visit: Payer: Self-pay

## 2021-05-17 ENCOUNTER — Encounter (HOSPITAL_COMMUNITY): Payer: Self-pay

## 2021-05-17 VITALS — BP 132/91 | HR 88 | Temp 97.7°F | Resp 16 | Ht 72.0 in | Wt 231.0 lb

## 2021-05-17 DIAGNOSIS — T8453XD Infection and inflammatory reaction due to internal right knee prosthesis, subsequent encounter: Secondary | ICD-10-CM

## 2021-05-17 DIAGNOSIS — Z01818 Encounter for other preprocedural examination: Secondary | ICD-10-CM | POA: Insufficient documentation

## 2021-05-17 DIAGNOSIS — X58XXXD Exposure to other specified factors, subsequent encounter: Secondary | ICD-10-CM | POA: Insufficient documentation

## 2021-05-17 LAB — CBC
HCT: 54.2 % — ABNORMAL HIGH (ref 39.0–52.0)
Hemoglobin: 17.8 g/dL — ABNORMAL HIGH (ref 13.0–17.0)
MCH: 26.2 pg (ref 26.0–34.0)
MCHC: 32.8 g/dL (ref 30.0–36.0)
MCV: 79.7 fL — ABNORMAL LOW (ref 80.0–100.0)
Platelets: 355 10*3/uL (ref 150–400)
RBC: 6.8 MIL/uL — ABNORMAL HIGH (ref 4.22–5.81)
RDW: 17.3 % — ABNORMAL HIGH (ref 11.5–15.5)
WBC: 7.7 10*3/uL (ref 4.0–10.5)
nRBC: 0 % (ref 0.0–0.2)

## 2021-05-17 LAB — SURGICAL PCR SCREEN
MRSA, PCR: NEGATIVE
Staphylococcus aureus: NEGATIVE

## 2021-05-17 LAB — BASIC METABOLIC PANEL
Anion gap: 8 (ref 5–15)
BUN: 19 mg/dL (ref 8–23)
CO2: 26 mmol/L (ref 22–32)
Calcium: 8.9 mg/dL (ref 8.9–10.3)
Chloride: 100 mmol/L (ref 98–111)
Creatinine, Ser: 0.92 mg/dL (ref 0.61–1.24)
GFR, Estimated: >60 mL/min (ref >60)
Glucose, Bld: 102 mg/dL — ABNORMAL HIGH (ref 70–99)
Potassium: 3.4 mmol/L — ABNORMAL LOW (ref 3.5–5.1)
Sodium: 134 mmol/L — ABNORMAL LOW (ref 135–145)

## 2021-05-17 NOTE — Progress Notes (Signed)
DUE TO COVID-19 ONLY ONE VISITOR IS ALLOWED TO COME WITH YOU AND STAY IN THE WAITING ROOM ONLY DURING PRE OP AND PROCEDURE DAY OF SURGERY.  2 VISITOR  MAY VISIT WITH YOU AFTER SURGERY IN YOUR PRIVATE ROOM DURING VISITING HOURS ONLY! ?YOU MAY HAVE ONE PERSON SPEND THE NITE WITH YOU IN YOUR ROOM AFTER SURGERY.   ? ? ? ? Your procedure is scheduled on:  ?             05/20/2021  ? Report to [facility] Main  Entrance ? ? Report to admitting at        0800am           AM ?DO NOT BRING INSURANCE CARD, PICTURE ID OR WALLET DAY OF SURGERY.  ?  ? ? Call this number if you have problems the morning of surgery [phone]  ? ? REMEMBER: NO  SOLID FOODS , CANDY, GUM OR MINTS AFTER MIDNITE THE NITE BEFORE SURGERY .       Marland Kitchen CLEAR LIQUIDS UNTIL        0745am          DAY OF SURGERY.      PLEASE FINISH ENSURE DRINK PER SURGEON ORDER  WHICH NEEDS TO BE COMPLETED AT     0745am       MORNING OF SURGERY.   ? ? ? ? ?CLEAR LIQUID DIET ? ? ?Foods Allowed      ?WATER ?BLACK COFFEE ( SUGAR OK, NO MILK, CREAM OR CREAMER) REGULAR AND DECAF  ?TEA ( SUGAR OK NO MILK, CREAM, OR CREAMER) REGULAR AND DECAF  ?PLAIN JELLO ( NO RED)  ?FRUIT ICES ( NO RED, NO FRUIT PULP)  ?POPSICLES ( NO RED)  ?JUICE- APPLE, WHITE GRAPE AND WHITE CRANBERRY  ?SPORT DRINK LIKE GATORADE ( NO RED)  ?CLEAR BROTH ( VEGETABLE , CHICKEN OR BEEF)                                                               ? ?    ? ?BRUSH YOUR TEETH MORNING OF SURGERY AND RINSE YOUR MOUTH OUT, NO CHEWING GUM CANDY OR MINTS. ?  ? ? Take these medicines the morning of surgery with A SIP OF WATER:  none  ? ? ?DO NOT TAKE ANY DIABETIC MEDICATIONS DAY OF YOUR SURGERY ?                  ?            You may not have any metal on your body including hair pins and  ?            piercings  Do not wear jewelry, make-up, lotions, powders or perfumes, deodorant ?            Do not wear nail polish on your fingernails.   ?           IF YOU ARE A MALE AND WANT TO SHAVE UNDER ARMS OR LEGS PRIOR  TO SURGERY YOU MUST DO SO AT LEAST 48 HOURS PRIOR TO SURGERY.  ?            Men may shave face and neck. ? ? Do not bring valuables to the hospital. [place] NOT ?  RESPONSIBLE   FOR VALUABLES. ? Contacts, dentures or bridgework may not be worn into surgery. ? Leave suitcase in the car. After surgery it may be brought to your room. ? ?  ? Patients discharged the day of surgery will not be allowed to drive home. IF YOU ARE HAVING SURGERY AND GOING HOME THE SAME DAY, YOU MUST HAVE AN ADULT TO DRIVE YOU HOME AND BE WITH YOU FOR 24 HOURS. YOU MAY GO HOME BY TAXI OR UBER OR ORTHERWISE, BUT AN ADULT MUST ACCOMPANY YOU HOME AND STAY WITH YOU FOR 24 HOURS. ?  ? ?            Please read over the following fact sheets you were given: ?_____________________________________________________________________ ? ?[facility] - Preparing for Surgery ?Before surgery, you can play an important role.  Because skin is not sterile, your skin needs to be as free of germs as possible.  You can reduce the number of germs on your skin by washing with CHG (chlorahexidine gluconate) soap before surgery.  CHG is an antiseptic cleaner which kills germs and bonds with the skin to continue killing germs even after washing. ?Please DO NOT use if you have an allergy to CHG or antibacterial soaps.  If your skin becomes reddened/irritated stop using the CHG and inform your nurse when you arrive at Short Stay. ?Do not shave (including legs and underarms) for at least 48 hours prior to the first CHG shower.  You may shave your face/neck. ?Please follow these instructions carefully: ? 1.  Shower with CHG Soap the night before surgery and the  morning of Surgery. ? 2.  If you choose to wash your hair, wash your hair first as usual with your  normal  shampoo. ? 3.  After you shampoo, rinse your hair and body thoroughly to remove the  shampoo.                           4.  Use CHG as you would any other liquid soap.  You can apply chg  directly  to the skin and wash  ?                     Gently with a scrungie or clean washcloth. ? 5.  Apply the CHG Soap to your body ONLY FROM THE NECK DOWN.   Do not use on face/ open      ?                     Wound or open sores. Avoid contact with eyes, ears mouth and genitals (private parts).  ?                     Production manager,  Genitals (private parts) with your normal soap. ?            6.  Wash thoroughly, paying special attention to the area where your surgery  will be performed. ? 7.  Thoroughly rinse your body with warm water from the neck down. ? 8.  DO NOT shower/wash with your normal soap after using and rinsing off  the CHG Soap. ?               9.  Pat yourself dry with a clean towel. ?           10.  Wear clean pajamas. ?  11.  Place clean sheets on your bed the night of your first shower and do not  sleep with pets. ?Day of Surgery : ?Do not apply any lotions/deodorants the morning of surgery.  Please wear clean clothes to the hospital/surgery center. ? ?FAILURE TO FOLLOW THESE INSTRUCTIONS MAY RESULT IN THE CANCELLATION OF YOUR SURGERY ?PATIENT SIGNATURE_________________________________ ? ?NURSE SIGNATURE__________________________________ ? ?________________________________________________________________________  ? ? ?           ?

## 2021-05-19 ENCOUNTER — Encounter (HOSPITAL_COMMUNITY): Payer: Self-pay | Admitting: Orthopedic Surgery

## 2021-05-20 ENCOUNTER — Inpatient Hospital Stay (HOSPITAL_COMMUNITY): Payer: Medicare HMO | Admitting: Physician Assistant

## 2021-05-20 ENCOUNTER — Inpatient Hospital Stay: Payer: Self-pay

## 2021-05-20 ENCOUNTER — Encounter (HOSPITAL_COMMUNITY): Payer: Self-pay | Admitting: Orthopedic Surgery

## 2021-05-20 ENCOUNTER — Inpatient Hospital Stay (HOSPITAL_COMMUNITY)
Admission: RE | Admit: 2021-05-20 | Discharge: 2021-05-22 | DRG: 467 | Disposition: A | Discharge status: Home Health | Payer: Medicare HMO | Attending: Orthopedic Surgery | Admitting: Orthopedic Surgery

## 2021-05-20 ENCOUNTER — Encounter (HOSPITAL_COMMUNITY)
Admission: RE | Disposition: A | Discharge status: Home / Self Care | Payer: Self-pay | Source: Home / Self Care | Attending: Orthopedic Surgery

## 2021-05-20 ENCOUNTER — Other Ambulatory Visit: Payer: Self-pay

## 2021-05-20 ENCOUNTER — Inpatient Hospital Stay (HOSPITAL_COMMUNITY): Payer: Medicare HMO | Admitting: Anesthesiology

## 2021-05-20 DIAGNOSIS — T8453XA Infection and inflammatory reaction due to internal right knee prosthesis, initial encounter: Secondary | ICD-10-CM

## 2021-05-20 DIAGNOSIS — Z452 Encounter for adjustment and management of vascular access device: Secondary | ICD-10-CM | POA: Diagnosis not present

## 2021-05-20 DIAGNOSIS — T8453XD Infection and inflammatory reaction due to internal right knee prosthesis, subsequent encounter: Principal | ICD-10-CM

## 2021-05-20 DIAGNOSIS — I10 Essential (primary) hypertension: Secondary | ICD-10-CM | POA: Diagnosis present

## 2021-05-20 DIAGNOSIS — Z87891 Personal history of nicotine dependence: Secondary | ICD-10-CM

## 2021-05-20 DIAGNOSIS — B957 Other staphylococcus as the cause of diseases classified elsewhere: Secondary | ICD-10-CM | POA: Diagnosis present

## 2021-05-20 DIAGNOSIS — Z79899 Other long term (current) drug therapy: Secondary | ICD-10-CM | POA: Diagnosis not present

## 2021-05-20 DIAGNOSIS — Z01818 Encounter for other preprocedural examination: Secondary | ICD-10-CM

## 2021-05-20 DIAGNOSIS — Z1611 Resistance to penicillins: Secondary | ICD-10-CM | POA: Diagnosis present

## 2021-05-20 DIAGNOSIS — B958 Unspecified staphylococcus as the cause of diseases classified elsewhere: Secondary | ICD-10-CM | POA: Diagnosis not present

## 2021-05-20 DIAGNOSIS — Z8042 Family history of malignant neoplasm of prostate: Secondary | ICD-10-CM

## 2021-05-20 DIAGNOSIS — Z8261 Family history of arthritis: Secondary | ICD-10-CM | POA: Diagnosis not present

## 2021-05-20 DIAGNOSIS — Y792 Prosthetic and other implants, materials and accessory orthopedic devices associated with adverse incidents: Secondary | ICD-10-CM | POA: Diagnosis present

## 2021-05-20 DIAGNOSIS — Z8249 Family history of ischemic heart disease and other diseases of the circulatory system: Secondary | ICD-10-CM

## 2021-05-20 HISTORY — PX: EXCISIONAL TOTAL KNEE ARTHROPLASTY WITH ANTIBIOTIC SPACERS: SHX5827

## 2021-05-20 LAB — TYPE AND SCREEN
ABO/RH(D): O POS
Antibody Screen: NEGATIVE

## 2021-05-20 SURGERY — REMOVAL, TOTAL ARTHROPLASTY HARDWARE, KNEE, WITH ANTIBIOTIC SPACER INSERTION
Anesthesia: Spinal | Site: Knee | Laterality: Right

## 2021-05-20 MED ORDER — PHENOL 1.4 % MT LIQD: 1.0000 | OROMUCOSAL | Status: DC | PRN

## 2021-05-20 MED ORDER — PROPOFOL 500 MG/50ML IV EMUL
INTRAVENOUS | Status: AC
  Filled 2021-05-20: qty 50

## 2021-05-20 MED ORDER — METOCLOPRAMIDE HCL 5 MG/ML IJ SOLN: 5.0000 mg | Freq: Three times a day (TID) | INTRAMUSCULAR | Status: DC | PRN

## 2021-05-20 MED ORDER — PROPOFOL 10 MG/ML IV BOLUS
INTRAVENOUS | Status: DC | PRN
  Administered 2021-05-20: 85 ug/kg/min via INTRAVENOUS
  Administered 2021-05-20 (×2): 20 mg via INTRAVENOUS

## 2021-05-20 MED ORDER — DEXAMETHASONE SODIUM PHOSPHATE 10 MG/ML IJ SOLN
8.0000 mg | Freq: Once | INTRAMUSCULAR | Status: DC
  Filled 2021-05-20: qty 1

## 2021-05-20 MED ORDER — CEFAZOLIN SODIUM-DEXTROSE 2-4 GM/100ML-% IV SOLN
2.0000 g | Freq: Four times a day (QID) | INTRAVENOUS | Status: AC
  Administered 2021-05-20 (×2): 2 g via INTRAVENOUS
  Filled 2021-05-20 (×2): qty 100

## 2021-05-20 MED ORDER — CHLORHEXIDINE GLUCONATE 0.12 % MT SOLN
15.0000 mL | Freq: Once | OROMUCOSAL | Status: AC
  Administered 2021-05-20: 15 mL via OROMUCOSAL

## 2021-05-20 MED ORDER — HYDROMORPHONE HCL 1 MG/ML IJ SOLN
0.5000 mg | INTRAMUSCULAR | Status: DC | PRN
  Administered 2021-05-20 – 2021-05-21 (×2): 1 mg via INTRAVENOUS
  Filled 2021-05-20 (×2): qty 1

## 2021-05-20 MED ORDER — VANCOMYCIN HCL 1000 MG IV SOLR
INTRAVENOUS | Status: DC | PRN
  Administered 2021-05-20: 2000 mg

## 2021-05-20 MED ORDER — IRBESARTAN 150 MG PO TABS
150.0000 mg | ORAL_TABLET | Freq: Every day | ORAL | Status: DC
  Administered 2021-05-21 – 2021-05-22 (×2): 150 mg via ORAL
  Filled 2021-05-20 (×2): qty 1

## 2021-05-20 MED ORDER — TOBRAMYCIN SULFATE 1.2 G IJ SOLR
INTRAMUSCULAR | Status: DC | PRN
  Administered 2021-05-20: 2.4 g

## 2021-05-20 MED ORDER — METOCLOPRAMIDE HCL 5 MG PO TABS: 5.0000 mg | ORAL_TABLET | Freq: Three times a day (TID) | ORAL | Status: DC | PRN

## 2021-05-20 MED ORDER — TRANEXAMIC ACID-NACL 1000-0.7 MG/100ML-% IV SOLN
1000.0000 mg | INTRAVENOUS | Status: AC
  Administered 2021-05-20: 1000 mg via INTRAVENOUS
  Filled 2021-05-20: qty 100

## 2021-05-20 MED ORDER — BUPIVACAINE IN DEXTROSE 0.75-8.25 % IT SOLN
INTRATHECAL | Status: DC | PRN
  Administered 2021-05-20: 2 mL via INTRATHECAL

## 2021-05-20 MED ORDER — MENTHOL 3 MG MT LOZG: 1.0000 | LOZENGE | OROMUCOSAL | Status: DC | PRN

## 2021-05-20 MED ORDER — SODIUM CHLORIDE 0.9 % IV SOLN: INTRAVENOUS | Status: DC

## 2021-05-20 MED ORDER — LACTATED RINGERS IV SOLN: INTRAVENOUS | Status: DC

## 2021-05-20 MED ORDER — PHENYLEPHRINE HCL-NACL 20-0.9 MG/250ML-% IV SOLN
INTRAVENOUS | Status: DC | PRN
  Administered 2021-05-20: 30 ug/min via INTRAVENOUS

## 2021-05-20 MED ORDER — SODIUM CHLORIDE 0.9 % IR SOLN
Status: DC | PRN
Start: 2021-05-20 — End: 2021-05-20
  Administered 2021-05-20: 1000 mL

## 2021-05-20 MED ORDER — TOBRAMYCIN SULFATE 1.2 G IJ SOLR
INTRAMUSCULAR | Status: AC
  Filled 2021-05-20: qty 7.2

## 2021-05-20 MED ORDER — BUPIVACAINE HCL (PF) 0.5 % IJ SOLN
INTRAMUSCULAR | Status: DC | PRN
Start: 2021-05-20 — End: 2021-05-20
  Administered 2021-05-20: 20 mL via PERINEURAL

## 2021-05-20 MED ORDER — ONDANSETRON HCL 4 MG PO TABS: 4.0000 mg | ORAL_TABLET | Freq: Four times a day (QID) | ORAL | Status: DC | PRN

## 2021-05-20 MED ORDER — VALSARTAN-HYDROCHLOROTHIAZIDE 160-25 MG PO TABS
1.0000 | ORAL_TABLET | Freq: Every day | ORAL | Status: DC
Start: 2021-05-21 — End: 2021-05-20

## 2021-05-20 MED ORDER — ACETAMINOPHEN 10 MG/ML IV SOLN: 1000.0000 mg | Freq: Once | INTRAVENOUS | Status: DC | PRN

## 2021-05-20 MED ORDER — CEFAZOLIN SODIUM-DEXTROSE 2-4 GM/100ML-% IV SOLN
2.0000 g | INTRAVENOUS | Status: AC
  Administered 2021-05-20: 2 g via INTRAVENOUS
  Filled 2021-05-20: qty 100

## 2021-05-20 MED ORDER — PHENYLEPHRINE 80 MCG/ML (10ML) SYRINGE FOR IV PUSH (FOR BLOOD PRESSURE SUPPORT)
PREFILLED_SYRINGE | INTRAVENOUS | Status: AC
  Filled 2021-05-20: qty 20

## 2021-05-20 MED ORDER — VANCOMYCIN HCL 1000 MG IV SOLR
INTRAVENOUS | Status: DC | PRN
  Administered 2021-05-20: 3000 mg

## 2021-05-20 MED ORDER — SODIUM CHLORIDE 0.9% FLUSH
10.0000 mL | INTRAVENOUS | Status: DC | PRN
  Administered 2021-05-22: 10 mL

## 2021-05-20 MED ORDER — METHOCARBAMOL 500 MG PO TABS
500.0000 mg | ORAL_TABLET | Freq: Four times a day (QID) | ORAL | Status: DC | PRN
  Administered 2021-05-20 – 2021-05-21 (×3): 500 mg via ORAL
  Filled 2021-05-20 (×3): qty 1

## 2021-05-20 MED ORDER — DOCUSATE SODIUM 100 MG PO CAPS
100.0000 mg | ORAL_CAPSULE | Freq: Two times a day (BID) | ORAL | Status: DC
  Administered 2021-05-20 – 2021-05-22 (×4): 100 mg via ORAL
  Filled 2021-05-20 (×4): qty 1

## 2021-05-20 MED ORDER — FENTANYL CITRATE PF 50 MCG/ML IJ SOSY
PREFILLED_SYRINGE | INTRAMUSCULAR | Status: AC
  Administered 2021-05-20: 50 ug
  Filled 2021-05-20: qty 2

## 2021-05-20 MED ORDER — CELECOXIB 200 MG PO CAPS
200.0000 mg | ORAL_CAPSULE | Freq: Two times a day (BID) | ORAL | Status: DC
  Administered 2021-05-20 – 2021-05-22 (×4): 200 mg via ORAL
  Filled 2021-05-20 (×4): qty 1

## 2021-05-20 MED ORDER — PHENYLEPHRINE 80 MCG/ML (10ML) SYRINGE FOR IV PUSH (FOR BLOOD PRESSURE SUPPORT)
PREFILLED_SYRINGE | INTRAVENOUS | Status: DC | PRN
  Administered 2021-05-20 (×3): 80 ug via INTRAVENOUS
  Administered 2021-05-20: 40 ug via INTRAVENOUS
  Administered 2021-05-20: 80 ug via INTRAVENOUS
  Administered 2021-05-20: 40 ug via INTRAVENOUS

## 2021-05-20 MED ORDER — MIDAZOLAM HCL 2 MG/2ML IJ SOLN
INTRAMUSCULAR | Status: AC
  Administered 2021-05-20: 1 mg
  Filled 2021-05-20: qty 2

## 2021-05-20 MED ORDER — STERILE WATER FOR IRRIGATION IR SOLN
Status: DC | PRN
Start: 2021-05-20 — End: 2021-05-20
  Administered 2021-05-20: 2000 mL

## 2021-05-20 MED ORDER — PROPOFOL 1000 MG/100ML IV EMUL
INTRAVENOUS | Status: AC
  Filled 2021-05-20: qty 100

## 2021-05-20 MED ORDER — OXYCODONE HCL 5 MG PO TABS
10.0000 mg | ORAL_TABLET | ORAL | Status: DC | PRN
  Administered 2021-05-20: 10 mg via ORAL
  Administered 2021-05-21 – 2021-05-22 (×3): 15 mg via ORAL
  Filled 2021-05-20 (×3): qty 3
  Filled 2021-05-20: qty 2

## 2021-05-20 MED ORDER — TOBRAMYCIN SULFATE 1.2 G IJ SOLR
INTRAMUSCULAR | Status: DC | PRN
  Administered 2021-05-20: 3.6 g

## 2021-05-20 MED ORDER — ACETAMINOPHEN 325 MG PO TABS: 325.0000 mg | ORAL_TABLET | Freq: Four times a day (QID) | ORAL | Status: DC | PRN

## 2021-05-20 MED ORDER — ONDANSETRON HCL 4 MG/2ML IJ SOLN: 4.0000 mg | Freq: Once | INTRAMUSCULAR | Status: DC | PRN

## 2021-05-20 MED ORDER — OXYCODONE HCL 5 MG PO TABS
5.0000 mg | ORAL_TABLET | ORAL | Status: DC | PRN
  Administered 2021-05-20: 10 mg via ORAL
  Administered 2021-05-21: 5 mg via ORAL
  Filled 2021-05-20 (×2): qty 2

## 2021-05-20 MED ORDER — LIDOCAINE 2% (20 MG/ML) 5 ML SYRINGE
INTRAMUSCULAR | Status: DC | PRN
  Administered 2021-05-20: 80 mg via INTRAVENOUS

## 2021-05-20 MED ORDER — HYDROMORPHONE HCL 1 MG/ML IJ SOLN: 0.2500 mg | INTRAMUSCULAR | Status: DC | PRN

## 2021-05-20 MED ORDER — CHLORHEXIDINE GLUCONATE CLOTH 2 % EX PADS
6.0000 | MEDICATED_PAD | Freq: Every day | CUTANEOUS | Status: DC
  Administered 2021-05-22: 6 via TOPICAL

## 2021-05-20 MED ORDER — ORAL CARE MOUTH RINSE: 15.0000 mL | Freq: Once | OROMUCOSAL | Status: AC

## 2021-05-20 MED ORDER — EPHEDRINE 5 MG/ML INJ
INTRAVENOUS | Status: AC
  Filled 2021-05-20: qty 5

## 2021-05-20 MED ORDER — VANCOMYCIN HCL 1000 MG IV SOLR
INTRAVENOUS | Status: AC
  Filled 2021-05-20: qty 120

## 2021-05-20 MED ORDER — BISACODYL 10 MG RE SUPP: 10.0000 mg | Freq: Every day | RECTAL | Status: DC | PRN

## 2021-05-20 MED ORDER — HYDROCHLOROTHIAZIDE 25 MG PO TABS
25.0000 mg | ORAL_TABLET | Freq: Every day | ORAL | Status: DC
  Administered 2021-05-21 – 2021-05-22 (×2): 25 mg via ORAL
  Filled 2021-05-20 (×2): qty 1

## 2021-05-20 MED ORDER — POVIDONE-IODINE 10 % EX SWAB
2.0000 "application " | Freq: Once | CUTANEOUS | Status: AC
  Administered 2021-05-20: 2 via TOPICAL

## 2021-05-20 MED ORDER — ONDANSETRON HCL 4 MG/2ML IJ SOLN
INTRAMUSCULAR | Status: DC | PRN
  Administered 2021-05-20: 4 mg via INTRAVENOUS

## 2021-05-20 MED ORDER — VANCOMYCIN HCL 1000 MG IV SOLR
INTRAVENOUS | Status: DC | PRN
  Administered 2021-05-20: 1000 mg

## 2021-05-20 MED ORDER — ONDANSETRON HCL 4 MG/2ML IJ SOLN: 4.0000 mg | Freq: Four times a day (QID) | INTRAMUSCULAR | Status: DC | PRN

## 2021-05-20 MED ORDER — FERROUS SULFATE 325 (65 FE) MG PO TABS
325.0000 mg | ORAL_TABLET | Freq: Three times a day (TID) | ORAL | Status: DC
  Administered 2021-05-21 – 2021-05-22 (×2): 325 mg via ORAL
  Filled 2021-05-20 (×2): qty 1

## 2021-05-20 MED ORDER — DEXAMETHASONE SODIUM PHOSPHATE 10 MG/ML IJ SOLN
INTRAMUSCULAR | Status: DC | PRN
  Administered 2021-05-20: 8 mg via INTRAVENOUS

## 2021-05-20 MED ORDER — VANCOMYCIN HCL IN DEXTROSE 1-5 GM/200ML-% IV SOLN
INTRAVENOUS | Status: AC
  Filled 2021-05-20: qty 200

## 2021-05-20 MED ORDER — DIPHENHYDRAMINE HCL 12.5 MG/5ML PO ELIX
12.5000 mg | ORAL_SOLUTION | ORAL | Status: DC | PRN
  Administered 2021-05-20: 25 mg via ORAL
  Filled 2021-05-20: qty 10

## 2021-05-20 MED ORDER — OXYCODONE HCL 5 MG/5ML PO SOLN: 5.0000 mg | Freq: Once | ORAL | Status: DC | PRN

## 2021-05-20 MED ORDER — METHOCARBAMOL 500 MG IVPB - SIMPLE MED
500.0000 mg | Freq: Four times a day (QID) | INTRAVENOUS | Status: DC | PRN
  Filled 2021-05-20: qty 50

## 2021-05-20 MED ORDER — ASPIRIN 81 MG PO CHEW
81.0000 mg | CHEWABLE_TABLET | Freq: Two times a day (BID) | ORAL | Status: DC
  Administered 2021-05-20 – 2021-05-22 (×4): 81 mg via ORAL
  Filled 2021-05-20 (×4): qty 1

## 2021-05-20 MED ORDER — SODIUM CHLORIDE 0.9 % IV SOLN
INTRAVENOUS | Status: DC | PRN
  Administered 2021-05-20: 1000 mg via INTRAVENOUS

## 2021-05-20 MED ORDER — SODIUM CHLORIDE 0.9% FLUSH
10.0000 mL | Freq: Two times a day (BID) | INTRAVENOUS | Status: DC
  Administered 2021-05-22: 10 mL

## 2021-05-20 MED ORDER — OXYCODONE HCL 5 MG PO TABS: 5.0000 mg | ORAL_TABLET | Freq: Once | ORAL | Status: DC | PRN

## 2021-05-20 MED ORDER — DEXAMETHASONE SODIUM PHOSPHATE 10 MG/ML IJ SOLN
10.0000 mg | Freq: Once | INTRAMUSCULAR | Status: AC
  Administered 2021-05-21: 10 mg via INTRAVENOUS
  Filled 2021-05-20: qty 1

## 2021-05-20 MED ORDER — TRANEXAMIC ACID-NACL 1000-0.7 MG/100ML-% IV SOLN
1000.0000 mg | Freq: Once | INTRAVENOUS | Status: AC
  Administered 2021-05-20: 1000 mg via INTRAVENOUS
  Filled 2021-05-20: qty 100

## 2021-05-20 MED ORDER — POLYETHYLENE GLYCOL 3350 17 G PO PACK: 17.0000 g | PACK | Freq: Every day | ORAL | Status: DC | PRN

## 2021-05-20 SURGICAL SUPPLY — 61 items
AUGMENT TIBL 52 AP 81 KNEE LRG (Miscellaneous) IMPLANT
BAG ZIPLOCK 12X15 (MISCELLANEOUS) ×2 IMPLANT
BLADE SAW SGTL 13.0X1.19X90.0M (BLADE) ×1 IMPLANT
BNDG ELASTIC 6X5.8 VLCR STR LF (GAUZE/BANDAGES/DRESSINGS) ×2 IMPLANT
BOWL SMART MIX CTS (DISPOSABLE) ×2 IMPLANT
BRUSH FEMORAL CANAL (MISCELLANEOUS) ×2 IMPLANT
CEMENT HV SMART SET (Cement) ×9 IMPLANT
COVER SURGICAL LIGHT HANDLE (MISCELLANEOUS) ×2 IMPLANT
CUFF TOURN SGL QUICK 34 (TOURNIQUET CUFF) ×1
CUFF TRNQT CYL 34X4.125X (TOURNIQUET CUFF) ×1 IMPLANT
DERMABOND ADVANCED (GAUZE/BANDAGES/DRESSINGS) ×1
DERMABOND ADVANCED .7 DNX12 (GAUZE/BANDAGES/DRESSINGS) ×1 IMPLANT
DRAPE INCISE IOBAN 66X45 STRL (DRAPES) ×6 IMPLANT
DRAPE U-SHAPE 47X51 STRL (DRAPES) ×2 IMPLANT
DRESSING AQUACEL AG SP 3.5X10 (GAUZE/BANDAGES/DRESSINGS) ×1 IMPLANT
DRESSING MEPILEX FLEX 4X4 (GAUZE/BANDAGES/DRESSINGS) IMPLANT
DRSG AQUACEL AG ADV 3.5X10 (GAUZE/BANDAGES/DRESSINGS) ×1 IMPLANT
DRSG AQUACEL AG ADV 3.5X14 (GAUZE/BANDAGES/DRESSINGS) ×1 IMPLANT
DRSG AQUACEL AG SP 3.5X10 (GAUZE/BANDAGES/DRESSINGS) ×2
DRSG MEPILEX FLEX 4X4 (GAUZE/BANDAGES/DRESSINGS) ×2
DRSG PAD ABDOMINAL 8X10 ST (GAUZE/BANDAGES/DRESSINGS) ×2 IMPLANT
DURAPREP 26ML APPLICATOR (WOUND CARE) ×4 IMPLANT
ELECT REM PT RETURN 15FT ADLT (MISCELLANEOUS) ×2 IMPLANT
EVACUATOR 1/8 PVC DRAIN (DRAIN) ×1 IMPLANT
FEMORAL 53 AP 75 KNEE LRG (Miscellaneous) ×1 IMPLANT
GAUZE XEROFORM 1X8 LF (GAUZE/BANDAGES/DRESSINGS) ×1 IMPLANT
GLOVE BIO SURGEON STRL SZ 6 (GLOVE) ×5 IMPLANT
GLOVE BIO SURGEON STRL SZ7 (GLOVE) ×3 IMPLANT
GLOVE BIOGEL PI IND STRL 6.5 (GLOVE) ×1 IMPLANT
GLOVE BIOGEL PI IND STRL 7.5 (GLOVE) ×2 IMPLANT
GLOVE BIOGEL PI IND STRL 8.5 (GLOVE) ×1 IMPLANT
GLOVE BIOGEL PI INDICATOR 6.5 (GLOVE) ×2
GLOVE BIOGEL PI INDICATOR 7.5 (GLOVE) ×3
GLOVE BIOGEL PI INDICATOR 8.5 (GLOVE) ×1
GLOVE ECLIPSE 8.0 STRL XLNG CF (GLOVE) ×4 IMPLANT
GLOVE INDICATOR 6.5 STRL GRN (GLOVE) ×4 IMPLANT
GLOVE INDICATOR 7.5 STRL GRN (GLOVE) ×2 IMPLANT
GOWN STRL REUS W/ TWL LRG LVL3 (GOWN DISPOSABLE) ×2 IMPLANT
GOWN STRL REUS W/TWL LRG LVL3 (GOWN DISPOSABLE) ×2
HANDPIECE INTERPULSE COAX TIP (DISPOSABLE) ×1
IMMOBILIZER KNEE 20 (SOFTGOODS) ×2
IMMOBILIZER KNEE 20 THIGH 36 (SOFTGOODS) IMPLANT
JET LAVAGE IRRISEPT WOUND (IRRIGATION / IRRIGATOR) ×2
KIT TURNOVER KIT A (KITS) ×1 IMPLANT
LAVAGE JET IRRISEPT WOUND (IRRIGATION / IRRIGATOR) ×1 IMPLANT
MANIFOLD NEPTUNE II (INSTRUMENTS) ×2 IMPLANT
NS IRRIG 1000ML POUR BTL (IV SOLUTION) ×2 IMPLANT
PACK TOTAL KNEE CUSTOM (KITS) ×2 IMPLANT
PROTECTOR NERVE ULNAR (MISCELLANEOUS) ×2 IMPLANT
SET HNDPC FAN SPRY TIP SCT (DISPOSABLE) ×1 IMPLANT
SET PAD KNEE POSITIONER (MISCELLANEOUS) ×2 IMPLANT
STAPLER VISISTAT 35W (STAPLE) IMPLANT
SUT MNCRL AB 3-0 PS2 18 (SUTURE) ×2 IMPLANT
SUT STRATAFIX PDS+ 0 24IN (SUTURE) ×2 IMPLANT
SUT VIC AB 1 CT1 36 (SUTURE) ×4 IMPLANT
SUT VIC AB 2-0 CT1 27 (SUTURE) ×3
SUT VIC AB 2-0 CT1 TAPERPNT 27 (SUTURE) ×3 IMPLANT
TIBIAL 52 AP 81 KNEE LRG (Miscellaneous) ×2 IMPLANT
TRAY FOLEY MTR SLVR 16FR STAT (SET/KITS/TRAYS/PACK) ×2 IMPLANT
WATER STERILE IRR 1000ML POUR (IV SOLUTION) ×2 IMPLANT
WRAP KNEE MAXI GEL POST OP (GAUZE/BANDAGES/DRESSINGS) ×2 IMPLANT

## 2021-05-20 NOTE — Anesthesia Procedure Notes (Signed)
Anesthesia Procedure Image    

## 2021-05-20 NOTE — Brief Op Note (Signed)
05/20/2021 ? ?1:53 PM ? ?PATIENT:  Shawn Harris  71 y.o. male ? ?PRE-OPERATIVE DIAGNOSIS:  Infected right total knee replacement ? ?POST-OPERATIVE DIAGNOSIS:  Infected right total knee replacement ? ?PROCEDURE:  Procedure(s): ?EXCISIONAL TOTAL KNEE ARTHROPLASTY WITH ANTIBIOTIC SPACERS (Right) ? ?SURGEON:  Surgeon(s) and Role: ?   Durene Romans, MD - Primary ? ?PHYSICIAN ASSISTANT: Rosalene Billings, PA-C ? ?ANESTHESIA:   regional and spinal ? ?EBL:  600 mL  ? ?BLOOD ADMINISTERED:none ? ?DRAINS: (medium) Hemovact drain(s) in the right with  Suction Open  ? ?LOCAL MEDICATIONS USED:  1 gm Vancomycin powder ? ?SPECIMEN:  None today ? ?DISPOSITION OF SPECIMEN:  N/A ? ?COUNTS:  YES ? ?TOURNIQUET:   ?Total Tourniquet Time Documented: ?Thigh (Right) - 60 minutes ?Total: Thigh (Right) - 60 minutes ? ? ?DICTATION: .Other Dictation: Dictation Number [id number] ? ?PLAN OF CARE: Admit to inpatient  ? ?PATIENT DISPOSITION:  PACU - hemodynamically stable. ?  ?Delay start of Pharmacological VTE agent (>24hrs) due to surgical blood loss or risk of bleeding: no ? ?

## 2021-05-20 NOTE — Anesthesia Preprocedure Evaluation (Signed)
Anesthesia Evaluation  ?Patient identified by MRN, date of birth, ID band ?Patient awake ? ? ? ?Reviewed: ?Allergy & Precautions, NPO status , Patient's Chart, lab work & pertinent test results ? ?Airway ?Mallampati: II ? ?TM Distance: >3 FB ?Neck ROM: Full ? ? ? Dental ? ?(+) Missing ?  ?Pulmonary ?neg pulmonary ROS,  ?  ?Pulmonary exam normal ?breath sounds clear to auscultation ? ? ? ? ? ? Cardiovascular ?hypertension, Pt. on medications ?Normal cardiovascular exam ?Rhythm:Regular Rate:Normal ? ? ?  ?Neuro/Psych ?negative neurological ROS ? negative psych ROS  ? GI/Hepatic ?negative GI ROS, Neg liver ROS,   ?Endo/Other  ?negative endocrine ROS ? Renal/GU ?negative Renal ROS  ?negative genitourinary ?  ?Musculoskeletal ? ?(+) Arthritis , Osteoarthritis,   ? Abdominal ?  ?Peds ?negative pediatric ROS ?(+)  Hematology ?negative hematology ROS ?(+)   ?Anesthesia Other Findings ? ? Reproductive/Obstetrics ?negative OB ROS ? ?  ? ? ? ? ? ? ? ? ? ? ? ? ? ?  ?  ? ? ? ? ? ? ? ? ?Anesthesia Physical ?Anesthesia Plan ? ?ASA: 2 ? ?Anesthesia Plan: Spinal  ? ?Post-op Pain Management: Regional block*  ? ?Induction: Intravenous ? ?PONV Risk Score and Plan: 1 and Propofol infusion and Treatment may vary due to age or medical condition ? ?Airway Management Planned: Simple Face Mask ? ?Additional Equipment:  ? ?Intra-op Plan:  ? ?Post-operative Plan:  ? ?Informed Consent: I have reviewed the patients History and Physical, chart, labs and discussed the procedure including the risks, benefits and alternatives for the proposed anesthesia with the patient or authorized representative who has indicated his/her understanding and acceptance.  ? ? ? ?Dental advisory given ? ?Plan Discussed with: CRNA and Surgeon ? ?Anesthesia Plan Comments:   ? ? ? ? ? ? ?Anesthesia Quick Evaluation ? ?

## 2021-05-20 NOTE — Transfer of Care (Signed)
Immediate Anesthesia Transfer of Care Note ? ?Patient: Shawn Harris ? ?Procedure(s) Performed: Procedure(s): ?EXCISIONAL TOTAL KNEE ARTHROPLASTY WITH ANTIBIOTIC SPACERS (Right) ? ?Patient Location: PACU ? ?Anesthesia Type:Spinal ? ?Level of Consciousness:  sedated, patient cooperative and responds to stimulation ? ?Airway & Oxygen Therapy:Patient Spontanous Breathing and Patient connected to face mask oxgen ? ?Post-op Assessment:  Report given to PACU RN and Post -op Vital signs reviewed and stable ? ?Post vital signs:  Reviewed and stable ? ?Last Vitals:  ?Vitals:  ? 05/20/21 1048 05/20/21 1053  ?BP:  112/76  ?Pulse: 71 73  ?Resp: 17 16  ?Temp:    ?SpO2: 98% 98%  ? ? ?Complications: No apparent anesthesia complications ? ?

## 2021-05-20 NOTE — Anesthesia Procedure Notes (Signed)
Anesthesia Regional Block: Adductor canal block  ? ?Pre-Anesthetic Checklist: , timeout performed,  Correct Patient, Correct Site, Correct Laterality,  Correct Procedure, Correct Position, site marked,  Risks and benefits discussed,  Surgical consent,  Pre-op evaluation,  At surgeon's request and post-op pain management ? ?Laterality: Right ? ?Prep: chloraprep     ?  ?Needles:  ?Injection technique: Single-shot ? ?Needle Type: Echogenic Needle   ? ? ?Needle Length: 9cm  ? ? ? ? ?Additional Needles: ? ? ?Procedures:,,,, ultrasound used (permanent image in chart),,    ?Narrative:  ?Start time: 05/20/2021 10:11 AM ?End time: 05/20/2021 10:18 AM ?Injection made incrementally with aspirations every 5 mL. ? ?Performed by: Personally  ?Anesthesiologist: Eilene Ghazi, MD ? ?Additional Notes: ?Patient tolerated the procedure well without complications ? ? ? ?

## 2021-05-20 NOTE — Anesthesia Procedure Notes (Signed)
Spinal ? ?Patient location during procedure: OR ?Start time: 05/20/2021 11:54 AM ?End time: 05/20/2021 11:58 AM ?Reason for block: surgical anesthesia ?Staffing ?Performed: anesthesiologist  ?Anesthesiologist: Myrtie Soman, MD ?Preanesthetic Checklist ?Completed: patient identified, IV checked, site marked, risks and benefits discussed, surgical consent, monitors and equipment checked, pre-op evaluation and timeout performed ?Spinal Block ?Patient position: sitting ?Prep: Betadine ?Patient monitoring: heart rate, continuous pulse ox and blood pressure ?Approach: midline ?Location: L4-5 ?Injection technique: single-shot ?Needle ?Needle type: Sprotte  ?Needle gauge: 24 G ?Needle length: 9 cm ?Assessment ?Sensory level: T6 ?Events: CSF return ?Additional Notes ? ? ? ? ? ? ?

## 2021-05-20 NOTE — H&P (Signed)
?  History of right total knee arthroplasty from February 2022 with persistent pain and effusions.  He was seen and evaluated as a second opinion.  Findings including laboratory studies and aspiration concluded that he had an infected right total knee arthroplasty. ? ?Full history and physical data to be placed ? ?Exam findings: ?His right knee incision was well-healed without erythema however there was mild warmth and effusion as noted ?Near full active and passive extension and flexion over 120 degrees ?Stable medial and lateral collateral ligaments ?No significant lower extremity edema or erythema ? ?Imaging: ?Standing AP and lateral radiographs of the right knee had revealed stable well fixed femoral and tibial components.  There was no evidence of obvious loosening or complication ? ?Laboratory studies.  C-reactive protein was noted to be 36 with a high normal of 10 ?Sedimentation rate was 20 with a high normal of 30 ? ?Aspiration results: ?Total nucleated cell count was 19,946 ?Positive alpha defensin ?Positive synovial fluid CRP ?Positive neutrophil elastase ?Culture positive for Staphylococcus epidermidis resistant to oxacillin ? ?Assessment: ?1.  Infected right total knee arthroplasty ? ?Plan: ?Patient will be admitted with plan for resection arthroplasty and placement of an articulating cemented antibiotic spacer.  He will receive a PICC line.  We will consult infectious disease for management of antibiotic selection given the complexities of dealing with coag negative staph.  He will be in the hospital until plans arrange for PICC line as well as arranging for IV antibiotics. ?Postoperatively he will be partial weightbearing while allowing for range of motion. ?

## 2021-05-20 NOTE — Interval H&P Note (Signed)
History and Physical Interval Note: ? ?05/20/2021 ?11:30 AM ? ?Shawn Harris  has presented today for surgery, with the diagnosis of Infected right total knee.  The various methods of treatment have been discussed with the patient and family. After consideration of risks, benefits and other options for treatment, the patient has consented to  Procedure(s): ?EXCISIONAL TOTAL KNEE ARTHROPLASTY WITH ANTIBIOTIC SPACERS (Right) as a surgical intervention.  The patient's history has been reviewed, patient examined, no change in status, stable for surgery.  I have reviewed the patient's chart and labs.  Questions were answered to the patient's satisfaction.   ? ? ?Shelda Pal ? ? ?

## 2021-05-20 NOTE — Progress Notes (Signed)
Therapy Plans: none at first ?Disposition: Home with wife ?Planned DVT Prophylaxis: aspirin 81mg  BID ?DME needed: walker ?PCP: Dr. Kary Kos, ?TXA: IV ?Allergies: NKDA ?Anesthesia Concerns: none ?BMI: 33.2 ?Last HgbA1c: Not diabetic ? ?Other: ?- Oxycodone (30), robaxin, tylenol ?

## 2021-05-20 NOTE — Progress Notes (Signed)
M

## 2021-05-20 NOTE — Discharge Instructions (Signed)

## 2021-05-20 NOTE — Progress Notes (Signed)
Assisted Dr. Rose with right, adductor canal block. Side rails up, monitors on throughout procedure. See vital signs in flow sheet. Tolerated Procedure well.  

## 2021-05-20 NOTE — Progress Notes (Signed)
Peripherally Inserted Central Catheter Placement ? ?The IV Nurse has discussed with the patient and/or persons authorized to consent for the patient, the purpose of this procedure and the potential benefits and risks involved with this procedure.  The benefits include less needle sticks, lab draws from the catheter, and the patient may be discharged home with the catheter. Risks include, but not limited to, infection, bleeding, blood clot (thrombus formation), and puncture of an artery; nerve damage and irregular heartbeat and possibility to perform a PICC exchange if needed/ordered by physician.  Alternatives to this procedure were also discussed.  Bard Power PICC patient education guide, fact sheet on infection prevention and patient information card has been provided to patient /or left at bedside.   ? ?PICC Placement Documentation  ?PICC Single Lumen 05/20/21 Right Brachial 42 cm 0 cm (Active)  ?Indication for Insertion or Continuance of Line Prolonged intravenous therapies 05/20/21 0833  ?Exposed Catheter (cm) 0 cm 05/20/21 0833  ?Site Assessment Clean, Dry, Intact 05/20/21 0833  ?Line Status Flushed;Blood return noted;Saline locked 05/20/21 0833  ?Dressing Type Transparent 05/20/21 0833  ?Dressing Status Antimicrobial disc in place 05/20/21 0833  ?Dressing Change Due 05/27/21 05/20/21 2951  ? ? ? ? ? ?Shawn Harris ?05/20/2021, 8:35 AM ? ?

## 2021-05-20 NOTE — Anesthesia Postprocedure Evaluation (Signed)
Anesthesia Post Note ? ?Patient: Shawn Harris ? ?Procedure(s) Performed: EXCISIONAL TOTAL KNEE ARTHROPLASTY WITH ANTIBIOTIC SPACERS (Right: Knee) ? ?  ? ?Patient location during evaluation: PACU ?Anesthesia Type: Spinal ?Level of consciousness: oriented and awake and alert ?Pain management: pain level controlled ?Vital Signs Assessment: post-procedure vital signs reviewed and stable ?Respiratory status: spontaneous breathing, respiratory function stable and patient connected to nasal cannula oxygen ?Cardiovascular status: blood pressure returned to baseline and stable ?Postop Assessment: no headache, no backache and no apparent nausea or vomiting ?Anesthetic complications: no ? ? ?No notable events documented. ? ?Last Vitals:  ?Vitals:  ? 05/20/21 1445 05/20/21 1500  ?BP: 100/71 111/77  ?Pulse: 62 62  ?Resp: 12 15  ?Temp:    ?SpO2: 97% 98%  ?  ?Last Pain:  ?Vitals:  ? 05/20/21 1500  ?TempSrc:   ?PainSc: 0-No pain  ? ? ?  ?  ?  ?  ?  ?  ? ?Eeva Schlosser S ? ? ? ? ?

## 2021-05-21 ENCOUNTER — Encounter (HOSPITAL_COMMUNITY): Payer: Self-pay | Admitting: Orthopedic Surgery

## 2021-05-21 DIAGNOSIS — Z452 Encounter for adjustment and management of vascular access device: Secondary | ICD-10-CM

## 2021-05-21 DIAGNOSIS — B958 Unspecified staphylococcus as the cause of diseases classified elsewhere: Secondary | ICD-10-CM

## 2021-05-21 DIAGNOSIS — T8453XA Infection and inflammatory reaction due to internal right knee prosthesis, initial encounter: Secondary | ICD-10-CM

## 2021-05-21 LAB — BASIC METABOLIC PANEL
Anion gap: 9 (ref 5–15)
BUN: 17 mg/dL (ref 8–23)
CO2: 25 mmol/L (ref 22–32)
Calcium: 8.1 mg/dL — ABNORMAL LOW (ref 8.9–10.3)
Chloride: 102 mmol/L (ref 98–111)
Creatinine, Ser: 0.84 mg/dL (ref 0.61–1.24)
GFR, Estimated: >60 mL/min (ref >60)
Glucose, Bld: 161 mg/dL — ABNORMAL HIGH (ref 70–99)
Potassium: 3.6 mmol/L (ref 3.5–5.1)
Sodium: 136 mmol/L (ref 135–145)

## 2021-05-21 LAB — CBC
HCT: 42.2 % (ref 39.0–52.0)
Hemoglobin: 13.9 g/dL (ref 13.0–17.0)
MCH: 26.6 pg (ref 26.0–34.0)
MCHC: 32.9 g/dL (ref 30.0–36.0)
MCV: 80.7 fL (ref 80.0–100.0)
Platelets: 299 10*3/uL (ref 150–400)
RBC: 5.23 MIL/uL (ref 4.22–5.81)
RDW: 15.6 % — ABNORMAL HIGH (ref 11.5–15.5)
WBC: 13.2 10*3/uL — ABNORMAL HIGH (ref 4.0–10.5)
nRBC: 0 % (ref 0.0–0.2)

## 2021-05-21 LAB — SEDIMENTATION RATE: Sed Rate: 25 mm/hr — ABNORMAL HIGH (ref 0–16)

## 2021-05-21 LAB — C-REACTIVE PROTEIN: CRP: 7.1 mg/dL — ABNORMAL HIGH (ref <1.0)

## 2021-05-21 MED ORDER — VANCOMYCIN HCL IN DEXTROSE 1-5 GM/200ML-% IV SOLN
1000.0000 mg | Freq: Two times a day (BID) | INTRAVENOUS | Status: DC
  Administered 2021-05-21: 1000 mg via INTRAVENOUS
  Filled 2021-05-21: qty 200

## 2021-05-21 MED ORDER — DAPTOMYCIN IV (FOR PTA / DISCHARGE USE ONLY): 700.0000 mg | INTRAVENOUS | 0 refills | Status: AC

## 2021-05-21 MED ORDER — SODIUM CHLORIDE 0.9 % IV SOLN
8.0000 mg/kg | Freq: Every day | INTRAVENOUS | Status: DC
  Filled 2021-05-21: qty 14

## 2021-05-21 MED ORDER — POLYETHYLENE GLYCOL 3350 17 G PO PACK
17.0000 g | PACK | Freq: Two times a day (BID) | ORAL | Status: DC
  Administered 2021-05-21 – 2021-05-22 (×3): 17 g via ORAL
  Filled 2021-05-21 (×3): qty 1

## 2021-05-21 MED ORDER — SODIUM CHLORIDE 0.9 % IV SOLN
8.0000 mg/kg | Freq: Every day | INTRAVENOUS | Status: DC
Start: 2021-05-21 — End: 2021-05-22
  Administered 2021-05-21 – 2021-05-22 (×2): 700 mg via INTRAVENOUS
  Filled 2021-05-21 (×2): qty 14

## 2021-05-21 NOTE — Consult Note (Addendum)
? ? ? ?[place] for Infectious Disease   ? ?Date of Admission:  05/20/2021    ? ?Total days of antibiotics 1 ?          ?Reason for Consult: Prosthetic Joint Infection    ?Referring Provider: Dr. Alvan Dame  ?Primary Care Provider: [place] Pink, MD ? ? ?ASSESSMENT: ? ?Mr. Shawn Harris is a 71 y/o male with right knee prosthetic joint infection and is POD#1  s/p excisional total knee arthroplasty with placement of antibiotic spacer. Office cultures are positive for MRSE. PICC line in place. Discussed plan of care to  include 6 weeks of IV antibiotics with Daptomycin and then follow up with Dr. Alvan Dame for determination of timing for placement of new knee. OPAT/Home Health orders. Remaining medical and supportive care per primary team.  ? ?PLAN: ? ?Continue current dose of vancomycin. Change to daptomycin if approved ?OPAT / Home Health Orders ?Arrange follow up with ID ?Remaining medical and supportive care per primary team.  ? ?ID will sign off. ? ?Diagnosis: ?Right knee prosthetic joint infection  ? ?Culture Result: MRSE ? ?No Known Allergies ? ?OPAT Orders ?Discharge antibiotics to be given via PICC line ?Discharge antibiotics: Daptomycin  ?Per pharmacy protocol  ?Aim for Vancomycin trough 15-20 or AUC 400-550 (unless otherwise indicated) ?Duration: ?6 weeks  ?End Date: ?07/01/21 ? ?[facility] Care Per Protocol: ? ?Home health RN for IV administration and teaching; PICC line care and labs.   ? ?Labs weekly while on IV antibiotics: ?_X_ CBC with differential ?_X_ BMP ?__ CMP ?_X_ CRP ?_X_ ESR ?__ Vancomycin trough ?__ CK ? ?_X_ Please pull PIC at completion of IV antibiotics ?__ Please leave PIC in place until doctor has seen patient or been notified ? ?Fax weekly labs to [phone] ? ?Clinic Follow Up Appt: ? ?06/11/21 video visit with Dr. Juleen China at 2:30pm ? ?@ ? ? ? ?Principal Problem: ?  Infection of prosthetic right knee joint ([place]) ? ? ? aspirin  81 mg Oral BID  ? celecoxib  200 mg Oral BID  ? Chlorhexidine Gluconate  Cloth  6 each Topical Daily  ? docusate sodium  100 mg Oral BID  ? ferrous sulfate  325 mg Oral TID PC  ? irbesartan  150 mg Oral Daily  ? And  ? hydrochlorothiazide  25 mg Oral Daily  ? polyethylene glycol  17 g Oral BID  ? sodium chloride flush  10-40 mL Intracatheter Q12H  ? ? ? ?HPI: Shawn Harris is a 71 y.o. male with previous medical history of arthritis and hypertension admitted for right knee prosthetic joint infection.  ? ?[name]  initially underwent arthroplasty in February 2022 and has had persistent pain and effusions. Recovery was uneventful until about 3 months following surgery when he developed a pocket of fluid. Previous Orthopedic Surgeon placed him on Celebrex and has not been on any antibiotics. Evaluated by Dr. Alvan Dame at [facility] and found to have mild warmth and effusion. Aspiration of the knee revealed WBC count of 19,946 with neutrophil percentage of 94% Culture was positive for MRSE. Brought to the OR on 05/20/21 for excisional total knee arthroplasty with antibiotic spacer placement. Denies any recent fevers or chills.  ? ? ?Review of Systems: ?Review of Systems  ?Constitutional:  Negative for chills, fever and weight loss.  ?Respiratory:  Negative for cough, shortness of breath and wheezing.   ?Cardiovascular:  Negative for chest pain and leg swelling.  ?Gastrointestinal:  Negative for abdominal pain, constipation, diarrhea,  nausea and vomiting.  ?Skin:  Negative for rash.  ? ? ?Past Medical History:  ?Diagnosis Date  ? Arthritis   ? Hemochromatosis   ? Hypertension   ? ? ?Social History  ? ?Tobacco Use  ? Smoking status: Never  ? Smokeless tobacco: Former  ?  Quit date: 03/10/2015  ?Vaping Use  ? Vaping Use: Never used  ? ? ?Family History  ?Problem Relation Age of Onset  ? Cirrhosis Mother   ? Hemachromatosis Mother   ? Coronary artery disease Father   ? Dementia Father   ? Prostate cancer Father   ? Arthritis Sister   ? Pancreatic disease Brother   ? ? ?No Known  Allergies ? ?OBJECTIVE: ?Blood pressure 109/73, pulse 64, temperature 98 ?F (36.7 ?C), resp. rate 16, height 6' (1.829 m), weight 106.1 kg, SpO2 98 %. ? ?Physical Exam ?Constitutional:   ?   General: He is not in acute distress. ?   Appearance: He is well-developed.  ?Cardiovascular:  ?   Rate and Rhythm: Normal rate and regular rhythm.  ?   Heart sounds: Normal heart sounds.  ?Pulmonary:  ?   Effort: Pulmonary effort is normal.  ?   Breath sounds: Normal breath sounds.  ?Skin: ?   General: Skin is warm and dry.  ?Neurological:  ?   Mental Status: He is alert and oriented to person, place, and time.  ?Psychiatric:     ?   Behavior: Behavior normal.     ?   Thought Content: Thought content normal.     ?   Judgment: Judgment normal.  ? ? ?Lab Results ?Lab Results  ?Component Value Date  ? WBC 13.2 (H) 05/21/2021  ? HGB 13.9 05/21/2021  ? HCT 42.2 05/21/2021  ? MCV 80.7 05/21/2021  ? PLT 299 05/21/2021  ?  ?Lab Results  ?Component Value Date  ? CREATININE 0.84 05/21/2021  ? BUN 17 05/21/2021  ? NA 136 05/21/2021  ? K 3.6 05/21/2021  ? CL 102 05/21/2021  ? CO2 25 05/21/2021  ?  ?Lab Results  ?Component Value Date  ? ALT 58 (H) 02/18/2020  ? AST 41 02/18/2020  ? ALKPHOS 65 02/18/2020  ? BILITOT 1.1 02/18/2020  ?  ? ?Microbiology: ?Recent Results (from the past 240 hour(s))  ?Surgical pcr screen     Status: None  ? Collection Time: 05/17/21  3:27 PM  ? Specimen: Nasal Mucosa; Nasal Swab  ?Result Value Ref Range Status  ? MRSA, PCR NEGATIVE NEGATIVE Final  ? Staphylococcus aureus NEGATIVE NEGATIVE Final  ?  Comment: (NOTE) ?The Xpert SA Assay (FDA approved for NASAL specimens in patients 32 ?years of age and older), is one component of a comprehensive ?surveillance program. It is not intended to diagnose infection nor to ?guide or monitor treatment. ?Performed at [facility], [address] ?[place], [place] [address] ?  ? ? ? ?Terri Piedra, NP ?[place] for Infectious Disease ?[facility] ? ?05/21/2021 ? ?1:25 PM ? ?

## 2021-05-21 NOTE — Addendum Note (Signed)
Addendum  created 05/21/21 0756 by Lavina Hamman, CRNA  ? Intraprocedure Staff edited  ?  ?

## 2021-05-21 NOTE — TOC Transition Note (Signed)
Transition of Care (TOC) - CM/SW Discharge Note ? ? ?Patient Details  ?Name: Shawn Harris ?MRN: [id number] ?Date of Birth: [birth year] ? ?Transition of Care (TOC) CM/SW Contact:  ?Mickelle Goupil, LCSW ?Phone Number: ?05/21/2021, 12:41 PM ? ? ?Clinical Narrative:    ? ?Met with pt and wife today to review DME needs and plans for [facility] IV abx as well as HHRN and PT.  Pt confirms he lives in [place], [place] ([place]  They have no agency preferences.  RW already ordered by MD and delivered to room by [employer].  Have placed referral with [facility], RN) to supply IV abx.  Have placed HHRN/PT order with [facility].  [place] agency contact info placed on AVS.  Anticipated likely dc home tomorrow.  No further TOC needs. ? ?Final next level of care: [place] ?Barriers to Discharge: No Barriers Identified ? ? ?Patient Goals and CMS Choice ?Patient states their goals for this hospitalization and ongoing recovery are:: return home ?  ?  ? ?Discharge Placement ?  ?           ?  ?  ?  ?  ? ?Discharge Plan and Services ?  ?  ?           ?DME Arranged: Walker rolling ?DME Agency: [employer] ?  ?  ?  ?HH Arranged: RN, PT, IV Antibiotics ?[place] Agency: Other - See comment ([place]) ?Date HH Agency Contacted: 05/21/21 ?Time [place]: 1000 ?Representative spoke with at [place]: [place]; [facility] ? ?Social Determinants of Health (SDOH) Interventions ?  ? ? ?Readmission Risk Interventions ?   ? View : No data to display.  ?  ?  ?  ? ? ? ? ? ?

## 2021-05-21 NOTE — Progress Notes (Signed)
Physical Therapy Treatment ?Patient Details ?Name: Shawn Harris ?MRN: [id number] ?DOB: [birth year] ?Today's Date: 05/21/2021 ? ? ?History of Present Illness Patient is 71 y.o. male s/p Rt TKR for excision of infected TKA with placement antibiotic spacer. PMH signficant for OA, HTN, hemochromatosis, Rt TKA initialy on 02/21/20. ? ?  ?PT Comments  ? ? Patient remains highly motivated to progress and ambulated ~170' with RW and good use of UE's to maintain PWB on Rt LE. Pt required occasional assist and cues for safety with walker management during turns as he has tendency to move quickly, however no LOB noted. EOS reviewed exercises in bed for ROM and strengthening. Will continue to progress pt as able. ? ?  ?Recommendations for follow up therapy are one component of a multi-disciplinary discharge planning process, led by the attending physician.  Recommendations may be updated based on patient status, additional functional criteria and insurance authorization. ? ?Follow Up Recommendations ? Follow physician's recommendations for discharge plan and follow up therapies ?  ?  ?Assistance Recommended at Discharge Intermittent Supervision/Assistance  ?Patient can return home with the following A little help with walking and/or transfers;A little help with bathing/dressing/bathroom;Assistance with cooking/housework;Direct supervision/assist for medications management;Assist for transportation;Help with stairs or ramp for entrance ?  ?Equipment Recommendations ? Rolling walker (2 wheels)  ?  ?Recommendations for Other Services   ? ? ?  ?Precautions / Restrictions Precautions ?Precautions: Fall ?Restrictions ?Weight Bearing Restrictions: Yes ?RLE Weight Bearing: Partial weight bearing ?RLE Partial Weight Bearing Percentage or Pounds: 50%  ?  ? ?Mobility ? Bed Mobility ?Overal bed mobility: Needs Assistance ?Bed Mobility: Supine to Sit, Sit to Supine ?  ?  ?Supine to sit: Min guard, HOB elevated ?Sit to supine: Min guard, HOB  elevated ?  ?General bed mobility comments: guarding for safety, pt using Lt foot to assist Rt LE off/on EOB. no assist needed. ?  ? ?Transfers ?Overall transfer level: Needs assistance ?Equipment used: Rolling walker (2 wheels) ?Transfers: Sit to/from Stand ?Sit to Stand: Min assist, Min guard ?  ?  ?  ?  ?  ?General transfer comment: guarding for safety, pt demonstrated good technique to rise from EOB with single UE use to power up. good awareness for safe step reach back and to extend Rt LE when sitting. ?  ? ?Ambulation/Gait ?Ambulation/Gait assistance: Min assist, Min guard ?Gait Distance (Feet): 170 Feet ?Assistive device: Rolling walker (2 wheels) ?Gait Pattern/deviations: Step-to pattern, Decreased stride length, Decreased weight shift to right, Antalgic ?Gait velocity: decr ?  ?  ?General Gait Details: pt dmeonstrated good use of RW to maintain PWB to Rt LE with min guard for majority of gait. Min assist to steady intermittently with turns as pt moving quickly at times. No LOB noted. ? ? ?Stairs ?  ?  ?  ?  ?  ? ? ?Wheelchair Mobility ?  ? ?Modified Rankin (Stroke Patients Only) ?  ? ? ?  ?Balance Overall balance assessment: Needs assistance ?Sitting-balance support: Feet supported ?Sitting balance-Leahy Scale: Good ?  ?  ?Standing balance support: Reliant on assistive device for balance, During functional activity, Bilateral upper extremity supported ?Standing balance-Leahy Scale: Fair ?  ?  ?  ?  ?  ?  ?  ?  ?  ?  ?  ?  ?  ? ?  ?Cognition Arousal/Alertness: Awake/alert ?Behavior During Therapy: [facility] for tasks assessed/performed ?Overall Cognitive Status: Within Functional Limits for tasks assessed ?  ?  ?  ?  ?  ?  ?  ?  ?  ?  ?  ?  ?  ?  ?  ?  ?  ?  ?  ? ?  ?  Exercises Total Joint Exercises ?Ankle Circles/Pumps: AROM, Both, 15 reps ?Quad Sets: AROM, Right, 10 reps ?Heel Slides: AROM, Right, 10 reps ?Hip ABduction/ADduction: Right, 5 reps, AAROM ? ?  ?General Comments   ?  ?  ? ?Pertinent Vitals/Pain Pain  Assessment ?Pain Assessment: 0-10 ?Pain Score: 3  ?Faces Pain Scale: Hurts little more ?Pain Location: Rt knee ?Pain Descriptors / Indicators: Discomfort, Aching, Sore ?Pain Intervention(s): Limited activity within patient's tolerance, Monitored during session, Repositioned, Ice applied  ? ? ?Home Living   ?  ?  ?  ?  ?  ?  ?  ?  ?  ?   ?  ?Prior Function    ?  ?  ?   ? ?PT Goals (current goals can now be found in the care plan section) Acute Rehab PT Goals ?Patient Stated Goal: get knee better ?PT Goal Formulation: With patient ?Time For Goal Achievement: 05/28/21 ?Potential to Achieve Goals: Good ?Progress towards PT goals: Progressing toward goals ? ?  ?Frequency ? ? ? 7X/week ? ? ? ?  ?PT Plan Current plan remains appropriate  ? ? ?Co-evaluation   ?  ?  ?  ?  ? ?  ?AM-PAC PT "6 Clicks" Mobility   ?Outcome Measure ? Help needed turning from your back to your side while in a flat bed without using bedrails?: A Little ?Help needed moving from lying on your back to sitting on the side of a flat bed without using bedrails?: A Little ?Help needed moving to and from a bed to a chair (including a wheelchair)?: A Little ?Help needed standing up from a chair using your arms (e.g., wheelchair or bedside chair)?: A Little ?Help needed to walk in hospital room?: A Little ?Help needed climbing 3-5 steps with a railing? : A Little ?6 Click Score: 18 ? ?  ?End of Session Equipment Utilized During Treatment: Gait belt ?Activity Tolerance: Patient tolerated treatment well ?Patient left: in chair;with call bell/phone within reach;with chair alarm set;with family/visitor present;with nursing/sitter in room ?Nurse Communication: Mobility status ?PT Visit Diagnosis: Muscle weakness (generalized) (M62.81);Difficulty in walking, not elsewhere classified (R26.2) ?  ? ? ?Time: [id number] ?PT Time Calculation (min) (ACUTE ONLY): 25 min ? ?Charges:  $Gait Training: 8-22 mins ?$Therapeutic Exercise: 8-22 mins          ?          ? ?Gwynneth Albright  PT, DPT ?Acute Rehabilitation Services ?Office [phone] ?Pager [phone]  ? ? ?Jacques Navy ?05/21/2021, 2:51 PM ? ?

## 2021-05-21 NOTE — Progress Notes (Addendum)
Patient ID: Shawn Harris, male   DOB: 27-Jun-1950, 71 y.o.   MRN: [id number] ?Subjective: ?1 Day Post-Op Procedure(s) (LRB): ?EXCISIONAL TOTAL KNEE ARTHROPLASTY WITH ANTIBIOTIC SPACERS (Right) ?  ? ?Patient reports pain as moderate. ?Took a while to get pain under control ?Wasn't able to get ice until 10pm ?Currently relatively comfortbale ? ?Objective:  ? ?VITALS:   ?Vitals:  ? 05/21/21 0208 05/21/21 2563  ?BP: 112/64 122/72  ?Pulse: 81 66  ?Resp: 14 16  ?Temp: 98.3 ?F (36.8 ?C) 98.1 ?F (36.7 ?C)  ?SpO2: 96% 98%  ? ? ?Neurovascular intact ?Incision: dressing C/D/I ?HV drain clotted off very early - no output and canister clear ?Removed drain this am ? ?LABS ?Recent Labs  ?  05/21/21 ?0257  ?HGB 13.9  ?HCT 42.2  ?WBC 13.2*  ?PLT 299  ? ? ?Recent Labs  ?  05/21/21 ?0257  ?NA 136  ?K 3.6  ?BUN 17  ?CREATININE 0.84  ?GLUCOSE 161*  ? ? ?No results for input(s): LABPT, INR in the last 72 hours. ? ? ?Assessment/Plan: ?1 Day Post-Op Procedure(s) (LRB): ?EXCISIONAL TOTAL KNEE ARTHROPLASTY WITH ANTIBIOTIC SPACERS (Right) ? ? ?Advance diet ? ?Up with therapy - PWB,  knee immobilizer not necessary unless knee feels too unstable or awkward  ? ?Continue ABX therapy due to infected right total knee s/p resections (Staph epi) ? ?ID consult for assistance and management of post ABX selection and duration ? ?GI cocktail to prevent constipation ?

## 2021-05-21 NOTE — Evaluation (Signed)
Physical Therapy Evaluation ?Patient Details ?Name: LILIANA BRENTLINGER ?MRN: [id number] ?DOB: Oct 22, 1950 ?Today's Date: 05/21/2021 ? ?History of Present Illness ? Patient is 71 y.o. male s/p Rt TKR for excision of infected TKA with placement antibiotic spacer. PMH signficant for OA, HTN, hemochromatosis, Rt TKA initialy on 02/21/20. ? ?  ?Clinical Impression ? DIRCK BUTCH is a 71 y.o. male POD 1 s/p Rt TKR for infected prosthesis. Patient reports independence with mobility at baseline. Patient is now limited by functional impairments (see PT problem list below) and requires min guard/assist for transfers and gait with RW. Patient was able to ambulate ~50 feet with RW and min assist and cues to maintain PWB on Rt LE. Patient instructed in exercise to facilitate circulation to manage edema. Patient will benefit from continued skilled PT interventions to address impairments and progress towards PLOF. Acute PT will follow to progress mobility and stair training in preparation for safe discharge home.    ?   ? ?Recommendations for follow up therapy are one component of a multi-disciplinary discharge planning process, led by the attending physician.  Recommendations may be updated based on patient status, additional functional criteria and insurance authorization. ? ?Follow Up Recommendations Follow physician's recommendations for discharge plan and follow up therapies ? ?  ?Assistance Recommended at Discharge Intermittent Supervision/Assistance  ?Patient can return home with the following ? A little help with walking and/or transfers;A little help with bathing/dressing/bathroom;Assistance with cooking/housework;Direct supervision/assist for medications management;Assist for transportation;Help with stairs or ramp for entrance ? ?  ?Equipment Recommendations Rolling walker (2 wheels)  ?Recommendations for Other Services ?    ?  ?Functional Status Assessment Patient has had a recent decline in their functional status and  demonstrates the ability to make significant improvements in function in a reasonable and predictable amount of time.  ? ?  ?Precautions / Restrictions Precautions ?Precautions: Fall ?Restrictions ?Weight Bearing Restrictions: Yes ?RLE Weight Bearing: Partial weight bearing ?RLE Partial Weight Bearing Percentage or Pounds: 50%  ? ?  ? ?Mobility ? Bed Mobility ?Overal bed mobility: Needs Assistance ?Bed Mobility: Supine to Sit ?  ?  ?Supine to sit: Min guard, HOB elevated ?  ?  ?General bed mobility comments: guarding for safety, pt able to scoot LE's off EOB, light assist to prevent quick flexion of Rt knee as he sat to side. ?  ? ?Transfers ?Overall transfer level: Needs assistance ?Equipment used: Rolling walker (2 wheels) ?Transfers: Sit to/from Stand ?Sit to Stand: Min assist, Min guard ?  ?  ?  ?  ?  ?General transfer comment: light assist/guard for safety with power up from EOB, cues to maintain PWB on Rt LE in standing. ?  ? ?Ambulation/Gait ?Ambulation/Gait assistance: Min assist ?Gait Distance (Feet): 50 Feet ?Assistive device: Rolling walker (2 wheels) ?Gait Pattern/deviations: Step-to pattern, Decreased stride length, Decreased weight shift to right, Antalgic ?Gait velocity: decr ?  ?  ?General Gait Details: cues for step pattern and proximity to RW. cues to maintain PWB on Rt LE and pt demonstrated good use of UE's on walker to do so. no LOB noted. cues to keep walker on ground. ? ?Stairs ?  ?  ?  ?  ?  ? ?Wheelchair Mobility ?  ? ?Modified Rankin (Stroke Patients Only) ?  ? ?  ? ?Balance Overall balance assessment: Needs assistance ?Sitting-balance support: Feet supported ?Sitting balance-Leahy Scale: Good ?  ?  ?Standing balance support: Reliant on assistive device for balance, During functional activity, Bilateral upper extremity  supported ?Standing balance-Leahy Scale: Fair ?  ?  ?  ?  ?  ?  ?  ?  ?  ?  ?  ?  ?   ? ? ? ?Pertinent Vitals/Pain Pain Assessment ?Pain Assessment: Faces ?Faces Pain Scale:  Hurts little more ?Pain Location: Rt knee ?Pain Descriptors / Indicators: Discomfort, Aching, Sore ?Pain Intervention(s): Monitored during session, Limited activity within patient's tolerance, Repositioned  ? ? ?Home Living Family/patient expects to be discharged to:: Private residence ?Living Arrangements: Spouse/significant other ?Available Help at Discharge: Family ?Type of Home: House ?Home Access: Stairs to enter;Level entry ?Entrance Stairs-Rails: None ?Entrance Stairs-Number of Steps: 2 at back ?  ?Home Layout: One level ?Home Equipment: Agricultural consultantolling Walker (2 wheels);Rollator (4 wheels);Cane - single point;BSC/3in1;Shower seat;Shower seat - built in;Grab bars - tub/shower ?   ?  ?Prior Function Prior Level of Function : Independent/Modified Independent ?  ?  ?  ?  ?  ?  ?  ?  ?  ? ? ?Hand Dominance  ? Dominant Hand: Right ? ?  ?Extremity/Trunk Assessment  ? Upper Extremity Assessment ?Upper Extremity Assessment: Overall WFL for tasks assessed ?  ? ?Lower Extremity Assessment ?Lower Extremity Assessment: Overall WFL for tasks assessed ?  ? ?Cervical / Trunk Assessment ?Cervical / Trunk Assessment: Normal  ?Communication  ? Communication: No difficulties  ?Cognition Arousal/Alertness: Awake/alert ?Behavior During Therapy: [facility] for tasks assessed/performed ?Overall Cognitive Status: Within Functional Limits for tasks assessed ?  ?  ?  ?  ?  ?  ?  ?  ?  ?  ?  ?  ?  ?  ?  ?  ?  ?  ?  ? ?  ?General Comments   ? ?  ?Exercises Total Joint Exercises ?Ankle Circles/Pumps: AROM, Both, 15 reps, Seated  ? ?Assessment/Plan  ?  ?PT Assessment Patient needs continued PT services  ?PT Problem List Decreased strength;Decreased activity tolerance;Decreased range of motion;Decreased balance;Decreased mobility;Decreased knowledge of use of DME;Decreased safety awareness;Decreased knowledge of precautions ? ?   ?  ?PT Treatment Interventions DME instruction;Gait training;Stair training;Functional mobility training;Therapeutic  activities;Therapeutic exercise;Balance training;Patient/family education   ? ?PT Goals (Current goals can be found in the Care Plan section)  ?Acute Rehab PT Goals ?Patient Stated Goal: get knee better ?PT Goal Formulation: With patient ?Time For Goal Achievement: 05/28/21 ?Potential to Achieve Goals: Good ? ?  ?Frequency 7X/week ?  ? ? ?Co-evaluation   ?  ?  ?  ?  ? ? ?  ?AM-PAC PT "6 Clicks" Mobility  ?Outcome Measure Help needed turning from your back to your side while in a flat bed without using bedrails?: A Little ?Help needed moving from lying on your back to sitting on the side of a flat bed without using bedrails?: A Little ?Help needed moving to and from a bed to a chair (including a wheelchair)?: A Little ?Help needed standing up from a chair using your arms (e.g., wheelchair or bedside chair)?: A Little ?Help needed to walk in hospital room?: A Little ?Help needed climbing 3-5 steps with a railing? : A Little ?6 Click Score: 18 ? ?  ?End of Session Equipment Utilized During Treatment: Gait belt ?Activity Tolerance: Patient tolerated treatment well ?Patient left: in chair;with call bell/phone within reach;with chair alarm set;with family/visitor present;with nursing/sitter in room ?Nurse Communication: Mobility status ?PT Visit Diagnosis: Muscle weakness (generalized) (M62.81);Difficulty in walking, not elsewhere classified (R26.2) ?  ? ?Time: [id number] ?PT Time Calculation (min) (ACUTE ONLY): 28 min ? ? ?  Charges:   PT Evaluation ?$PT Eval Low Complexity: 1 Low ?PT Treatments ?$Gait Training: 8-22 mins ?  ?   ? ? ?Renaldo Fiddler PT, DPT ?Acute Rehabilitation Services ?Office [phone] ?Pager [phone]  ? ?Anitra Lauth ?05/21/2021, 1:02 PM ? ?

## 2021-05-21 NOTE — Progress Notes (Signed)
PHARMACY CONSULT NOTE FOR: ? ?OUTPATIENT  PARENTERAL ANTIBIOTIC THERAPY (OPAT) ? ?Indication: MRSE R-knee PJI ?Regimen: Daptomycin 700 mg IV every 24 hours ?End date: 07/01/21 ? ?IV antibiotic discharge orders are pended. ?To discharging provider:  please sign these orders via discharge navigator,  ?Select New Orders & click on the button choice - Manage This Unsigned Work.  ?  ? ?Thank you for allowing pharmacy to be a part of this patient?s care. ? ?Georgina Pillion, PharmD, BCPS ?Infectious Diseases Clinical Pharmacist ?05/21/2021 12:54 PM  ? ?**Pharmacist phone directory can now be found on [email or web address] ([email or web address]).  Listed under [facility].  ?

## 2021-05-22 LAB — CBC
HCT: 38.3 % — ABNORMAL LOW (ref 39.0–52.0)
Hemoglobin: 12.3 g/dL — ABNORMAL LOW (ref 13.0–17.0)
MCH: 26 pg (ref 26.0–34.0)
MCHC: 32.1 g/dL (ref 30.0–36.0)
MCV: 81 fL (ref 80.0–100.0)
Platelets: 250 10*3/uL (ref 150–400)
RBC: 4.73 MIL/uL (ref 4.22–5.81)
RDW: 15.9 % — ABNORMAL HIGH (ref 11.5–15.5)
WBC: 12.3 10*3/uL — ABNORMAL HIGH (ref 4.0–10.5)
nRBC: 0 % (ref 0.0–0.2)

## 2021-05-22 LAB — CK: Total CK: 129 U/L (ref 49–397)

## 2021-05-22 MED ORDER — DOCUSATE SODIUM 100 MG PO CAPS: 100.0000 mg | ORAL_CAPSULE | Freq: Two times a day (BID) | ORAL | 0 refills | Status: AC

## 2021-05-22 MED ORDER — OXYCODONE HCL 5 MG PO TABS: 5.0000 mg | ORAL_TABLET | ORAL | 0 refills | Status: DC | PRN

## 2021-05-22 MED ORDER — HEPARIN SOD (PORK) LOCK FLUSH 100 UNIT/ML IV SOLN
250.0000 [IU] | INTRAVENOUS | Status: AC | PRN
  Administered 2021-05-22: 250 [IU]
  Filled 2021-05-22: qty 2.5

## 2021-05-22 MED ORDER — POLYETHYLENE GLYCOL 3350 17 G PO PACK: 17.0000 g | PACK | Freq: Two times a day (BID) | ORAL | 0 refills | Status: AC | PRN

## 2021-05-22 MED ORDER — METHOCARBAMOL 500 MG PO TABS: 500.0000 mg | ORAL_TABLET | Freq: Four times a day (QID) | ORAL | 2 refills | Status: DC | PRN

## 2021-05-22 MED ORDER — ASPIRIN 81 MG PO CHEW: 81.0000 mg | CHEWABLE_TABLET | Freq: Two times a day (BID) | ORAL | 0 refills | Status: AC

## 2021-05-22 MED ORDER — FERROUS SULFATE 325 (65 FE) MG PO TABS: 325.0000 mg | ORAL_TABLET | Freq: Two times a day (BID) | ORAL | 1 refills | Status: AC

## 2021-05-22 MED ORDER — CELECOXIB 200 MG PO CAPS: 200.0000 mg | ORAL_CAPSULE | Freq: Two times a day (BID) | ORAL | 0 refills | Status: AC

## 2021-05-22 MED ORDER — ACETAMINOPHEN 500 MG PO TABS: 1000.0000 mg | ORAL_TABLET | Freq: Three times a day (TID) | ORAL | 1 refills | Status: AC

## 2021-05-22 NOTE — Progress Notes (Signed)
Patient leaving with PICC for home antibiotic use. Patient stated that they are delivering medication tonight and infusion nurse already educated patient. Patient has no further questions and feels comfortable with PICC care/education. PICC line evaluated/locked by IV team prior to dc ?

## 2021-05-22 NOTE — Op Note (Signed)
NAME: Shawn Harris, Shawn Harris. ?MEDICAL RECORD NO: [id number] ?ACCOUNT NO: [id number] ?DATE OF BIRTH: 04/09/1950 ?FACILITY: [facility] ?LOCATION: [facility] ?PHYSICIAN: Pietro Cassis. Alvan Dame, MD ? ?Operative Report  ? ?DATE OF PROCEDURE: 05/20/2021 ? ?PREOPERATIVE DIAGNOSIS:  Infected right total knee arthroplasty. ? ?POSTOPERATIVE DIAGNOSIS:  Infected right total knee arthroplasty. ? ?PROCEDURE: ?1.  Resection of an infected right total knee arthroplasty including excisional and non-excisional debridement of right knee. ?2.  Placement of articulating antibiotic spacer utilizing the DePuy KASM system with a total of 6 grams of vancomycin used plus 6 grams of tobramycin utilized. ? ?SURGEON:  Pietro Cassis. Alvan Dame, MD ? ?ASSISTANT:  Costella Hatcher, PA-C.  Note that Ms. Lu Duffel was present the entirety of the case from preoperative positioning, perioperative management of the operative extremity, general facilitation of the case and primary wound closure. ? ?ANESTHESIA:  Regional plus spinal. ? ?BLOOD LOSS:  About 600 mL recorded. ? ?DRAINS:  One medium Hemovac drain placed. ? 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 ? ?TOURNIQUET:  Up for 60 minutes at 225 mmHg. ? ?INDICATIONS FOR PROCEDURE:  The patient is a pleasant 71 year old male who was seen and evaluated for second opinion for his right knee.  He had reported with me persistent swelling to the right knee.  Aspiration was performed in the office, revealing  ?elevated total nucleated cells of 19,000.  Ultimately growing out Staphylococcus epidermidis.  He had an elevated CRP and a normal sedimentation rate.  Given these findings, we discussed definitive treatment, 2-stage treatment was discussed as the best  ?attempts at managing his knee infection.  The risks of recurrent infection, DVT, need for future surgeries as well as the postoperative course were reviewed for the potential of curing his  infection and maintain a normal knee function. ? ?DESCRIPTION OF PROCEDURE:  The patient was brought to the operative theater.  Once adequate anesthesia, preoperative antibiotics were given, which included Ancef, but then additionally a gram of vancomycin was given due to his known MRSE infection.  His  ?right lower extremity was placed into a thigh tourniquet.  The right lower extremity was then prepped and draped in sterile fashion.  A timeout was performed identifying the patient, planned procedure, and extremity. ? ?The right lower extremity was exsanguinated and tourniquet elevated to 225 mmHg.  His old incision was excised for a total of about 8 inches.  It was extended slightly proximal and distal for exposure.  Soft tissue planes created.  Median arthrotomy was  ?made, encountering inflammatory fluid within the joint.  He was also noted to have significant synovial reaction and scarring.  Initial portion of the procedure at this point was carried out as an extensive synovectomy involving the entire aspect of the  ?joint, medial, lateral and suprapatella, removing a significant amount of synovium as well as scar tissue.  Once the knee was exposed in routine fashion attention was directed to the patella.  An oscillating saw was used to remove the patellar button.  I ? then used a drill to remove the old polyethylene pegs as well as trying to remove some cement from the patella.  Once this was done, the patella was subluxated laterally and the knee was flexed.  I then used a thin ACL saw to undermine the bone cement  ?interface of the tibial component and then subsequently the femoral component.  I removed the femoral component with minimal bone loss and then  removed the tibial component with minimal bone loss.  Remaining cement was removed out of the proximal tibia.  ? I then used reamers on power reamed up to 17 mm to allow for debridement of the canals, followed by a non-excisional debridement with pulse  lavage irrigator with a canal brush irrigator.  Once this was done, we selected a size large KASM system femoral  ?and tibial components.  We began mixing cement as the knee was copiously irrigated with normal saline solution with 3 liters.  I then followed this up with IrriSept and Prontosan solution for sterilization of the joint.  We first mixed two batches of  ?cement with 2 grams of vancomycin and 2.4 grams of tobramycin.  We rolled these dowels to place into the distal femur and proximal tibia.  Additionally, another three batches of cement were mixed with 2 more grams of vancomycin and 2.4 tobramycin for the  ?tibial mold and the femoral mold.  We determined that a 15 mm cemented spacer would allow for full extension.  While these were cured further debridement and irrigation was carried out.  Once these had fully set up as I was maintaining pressure on the  ?distal femur.  The final batch of cement was mixed, so we could cement the tibial piece into place.  The knee was held in extension until the cement fully cured.  Once this was done, the knee was re-irrigated with some remaining solution about a liter of ? normal saline solution.  We then let the tourniquet down after 60 minutes.  The extensor mechanism was reapproximated using #1 Vicryl and #1 Stratafix suture.  The remaining wound was closed in layers of 2-0 Vicryl and a running Monocryl stitch.  I did  ?place a Hemovac drain.  We then placed 1 gram of vancomycin powder in the subcutaneous layer.  The knee was then wrapped in Ace wrap and placed into a knee immobilizer for initial stability.  The patient was then brought to recovery room in stable  ?condition, tolerating the procedure well. ? ?Postoperatively, I spoke to his wife regarding the findings and the postoperative plan.  We will have him have a PICC line placed as well as consult infectious disease to help arrange whether or not to use IV vancomycin versus daptomycin for  treatment. ? ? ?PUS ?D: 05/22/2021 12:10:36 pm T: 05/22/2021 3:13:00 pm  ?JOB: [id number]  ?

## 2021-05-22 NOTE — Progress Notes (Signed)
Patient ID: Shawn Harris, male   DOB: [birth year], 71 y.o.   MRN: [id number] ?Subjective: ?2 Days Post-Op Procedure(s) (LRB): ?EXCISIONAL TOTAL KNEE ARTHROPLASTY WITH ANTIBIOTIC SPACERS (Right) ?  ? ?Patient reports pain as mild to moderate ?Otherwise doing well.  Did well with PT yesterday ?Appreciate ID evaluation and recommendations ?Apparently all things are set for discharge today ? ?Objective:  ? ?VITALS:   ?Vitals:  ? 05/22/21 0119 05/22/21 0526  ?BP: 103/61 106/63  ?Pulse: 73 64  ?Resp: 18 17  ?Temp: 98 ?F (36.7 ?C) 98 ?F (36.7 ?C)  ?SpO2: 94% 98%  ? ? ?Neurologically intact ?Incision: dressing C/D/I - AC wrap removed, Aquacell dry ?Expected post op knee effusion ? ?LABS ?Recent Labs  ?  05/21/21 ?0257 05/22/21 ?0335  ?HGB 13.9 12.3*  ?HCT 42.2 38.3*  ?WBC 13.2* 12.3*  ?PLT 299 250  ? ? ?Recent Labs  ?  05/21/21 ?0257  ?NA 136  ?K 3.6  ?BUN 17  ?CREATININE 0.84  ?GLUCOSE 161*  ? ? ?No results for input(s): LABPT, INR in the last 72 hours. ? ? ?Assessment/Plan: ?2 Days Post-Op Procedure(s) (LRB): ?EXCISIONAL TOTAL KNEE ARTHROPLASTY WITH ANTIBIOTIC SPACERS (Right) ? ? ?Up with therapy ?Home today after PT and dose of Daptomycin ?HH PT and RN set up ?Daptomycin for 6 weeks - delivered to house ?RTC in 2 weeks  ?

## 2021-05-22 NOTE — Discharge Summary (Signed)
Physician Discharge Summary  ?Patient ID: ?Shawn Harris ?MRN: [id number] ?DOB/AGE: 10-04-1950 71 y.o. ? ?Admit date: 05/20/2021 ?Discharge date: 05/22/2021 ? ?Admission Diagnoses: Infection prosthesis R knee joint; hx fo hereditary hemochromatosis, HTN ? ?Discharge Diagnoses:  ?Principal Problem: ?  Infection of prosthetic right knee joint (Shawn Harris) ?Same as above ? ?Discharged Condition: stable ? ?Hospital Course: Patient presented to [facility] on 05/20/21 for elective excisional total knee arthroplasty with antibiotic spacers by Dr. Alvan Harris.  The patient tolerated the procedure well without complication.  He was admitted to the hospital for IV ABX.  ID team consulted.  Patient worked well with therapy.  He is to be D/C'd home on 05/22/21 with ABX per ID recommendation. ? ?Consults: ID ? ?Significant Diagnostic Studies: Aspirate obtained from R knee prior to procedure ? ?Treatments: IV hydration, antibiotics: daptomycin, analgesia: acetaminophen, Dilaudid, and oxycodone, cardiac meds: irbesartain, anticoagulation: ASA, and surgery: as stated above ? ?Discharge Exam: ?Blood pressure 106/63, pulse 64, temperature 98 ?F (36.7 ?C), temperature source Oral, resp. rate 17, height 6' (1.829 m), weight 106.1 kg, SpO2 98 %. ? ? ?Disposition: Discharge disposition: 01-Home or Self Care ? ? ? ? ? ? ?Discharge Instructions   ? ? [facility] pharmacist to adjust dose for Vancomycin, Aminoglycosides and other anti-infective therapies as requested by physician.   Complete by: As directed ?  ? [facility] to provide Cath Flo 88m   Complete by: As directed ?  ? Administer for PICC line occlusion and as ordered by physician for other access device issues.  ? Anaphylaxis Kit: Provided to treat any anaphylactic reaction to the medication being provided to the patient if First Dose or when requested by physician   Complete by: As directed ?  ? Epinephrine 128mml vial / amp: Administer 0.71m10m0.71ml81mubcutaneously once for moderate  to severe anaphylaxis, nurse to call physician and pharmacy when reaction occurs and call 911 if needed for immediate care  ? Diphenhydramine 50mg85mIV vial: Administer 25-50mg 44mM PRN for first dose reaction, rash, itching, mild reaction, nurse to call physician and pharmacy when reaction occurs  ? Sodium Chloride 0.9% NS 500ml I25mdminister if needed for hypovolemic blood pressure drop or as ordered by physician after call to physician with anaphylactic reaction  ? Call MD / Call 911   Complete by: As directed ?  ? If you experience chest pain or shortness of breath, CALL 911 and be transported to the hospital emergency room.  If you develope a fever above 101 F, pus (white drainage) or increased drainage or redness at the wound, or calf pain, call your surgeon's office.  ? Change dressing on IV access line weekly and PRN   Complete by: As directed ?  ? Constipation Prevention   Complete by: As directed ?  ? Drink plenty of fluids.  Prune juice may be helpful.  You may use a stool softener, such as Colace (over the counter) 100 mg twice a day.  Use MiraLax (over the counter) for constipation as needed.  ? Diet - low sodium heart healthy   Complete by: As directed ?  ? Flush IV access with Sodium Chloride 0.9% and Heparin 10 units/ml or 100 units/ml   Complete by: As directed ?  ? Home infusion instructions - [facility]   Complete by: As directed ?  ? Instructions: Flush IV access with Sodium Chloride 0.9% and Heparin 10units/ml or 100units/ml  ? Change dressing on IV access line: Weekly and  PRN  ? Instructions Cath Flo 28m: Administer for PICC Line occlusion and as ordered by physician for other access device  ? [facility] pharmacist to adjust dose for: Vancomycin, Aminoglycosides and other anti-infective therapies as requested by physician  ? Increase activity slowly as tolerated   Complete by: As directed ?  ? Method of administration may be changed at the discretion of home infusion  pharmacist based upon assessment of the patient and/or caregiver?s ability to self-administer the medication ordered   Complete by: As directed ?  ? Post-operative opioid taper instructions:   Complete by: As directed ?  ? POST-OPERATIVE OPIOID TAPER INSTRUCTIONS: ?It is important to wean off of your opioid medication as soon as possible. If you do not need pain medication after your surgery it is ok to stop day one. ?Opioids include: ?Codeine, Hydrocodone(Norco, Vicodin), Oxycodone(Percocet, oxycontin) and hydromorphone amongst others.  ?Long term and even short term use of opiods can cause: ?Increased pain response ?Dependence ?Constipation ?Depression ?Respiratory depression ?And more.  ?Withdrawal symptoms can include ?Flu like symptoms ?Nausea, vomiting ?And more ?Techniques to manage these symptoms ?Hydrate well ?Eat regular healthy meals ?Stay active ?Use relaxation techniques(deep breathing, meditating, yoga) ?Do Not substitute Alcohol to help with tapering ?If you have been on opioids for less than two weeks and do not have pain than it is ok to stop all together.  ?Plan to wean off of opioids ?This plan should start within one week post op of your joint replacement. ?Maintain the same interval or time between taking each dose and first decrease the dose.  ?Cut the total daily intake of opioids by one tablet each day ?Next start to increase the time between doses. ?The last dose that should be eliminated is the evening dose.  ? ?  ? ?  ? ?Allergies as of 05/22/2021   ?No Known Allergies ?  ? ?  ?Medication List  ?  ? ?STOP taking these medications   ? ?enoxaparin 40 MG/0.4ML injection ?Commonly known as: Lovenox ?  ? ?  ? ?TAKE these medications   ? ?acetaminophen 500 MG tablet ?Commonly known as: TYLENOL ?Take 2 tablets (1,000 mg total) by mouth every 8 (eight) hours. ?  ?aspirin 81 MG chewable tablet ?Chew 1 tablet (81 mg total) by mouth 2 (two) times daily. ?  ?celecoxib 200 MG capsule ?Commonly known as:  CELEBREX ?Take 1 capsule (200 mg total) by mouth 2 (two) times daily. ?  ?daptomycin  IVPB ?Commonly known as: CUBICIN ?Inject 700 mg into the vein daily. Indication:  MRSE R-knee PJI ?First Dose: Yes ?Last Day of Therapy:  07/01/21 ?Labs - Once weekly:  CBC/D, BMP, and CPK ?Labs - Every other week:  ESR and CRP ?Method of administration: IV Push ?Method of administration may be changed at the discretion of home infusion pharmacist based upon assessment of the patient and/or caregiver's ability to self-administer the medication ordered. ?  ?docusate sodium 100 MG capsule ?Commonly known as: COLACE ?Take 1 capsule (100 mg total) by mouth 2 (two) times daily. ?  ?ferrous sulfate 325 (65 FE) MG tablet ?Take 1 tablet (325 mg total) by mouth 2 (two) times daily with a meal. ?  ?methocarbamol 500 MG tablet ?Commonly known as: ROBAXIN ?Take 1 tablet (500 mg total) by mouth every 6 (six) hours as needed for muscle spasms. ?  ?oxyCODONE 5 MG immediate release tablet ?Commonly known as: Oxy IR/ROXICODONE ?Take 1-2 tablets (5-10 mg total) by mouth every 4 (four)  hours as needed for severe pain or moderate pain (pain). ?What changed: reasons to take this ?  ?polyethylene glycol 17 g packet ?Commonly known as: MIRALAX / GLYCOLAX ?Take 17 g by mouth 2 (two) times daily as needed. ?  ?testosterone cypionate 200 MG/ML injection ?Commonly known as: DEPOTESTOSTERONE CYPIONATE ?Inject 200 mg into the muscle every 14 (fourteen) days. ?  ?valsartan-hydrochlorothiazide 160-25 MG tablet ?Commonly known as: DIOVAN-HCT ?Take 1 tablet by mouth daily. ?  ? ?  ? ?  ?  ? ? ?  ?Discharge Care Instructions  ?(From admission, onward)  ?  ? ? ?  ? ?  Start     Ordered  ? 05/21/21 0000  Change dressing on IV access line weekly and PRN  (Home infusion instructions - [facility] )       ? 05/21/21 1454  ? ?  ?  ? ?  ? ? Follow-up Information   ? ? Paralee Cancel, MD. Schedule an appointment as soon as possible for a visit in 2 week(s).    ?Specialty: Orthopedic Surgery ?Contact information: ?[address] ?[address] ?[place] [address] ?[phone] ? ? ?  ?  ? ? [facility] Follow up.   ?Why: to provide home health RN and p

## 2021-05-22 NOTE — Plan of Care (Signed)
  Problem: Education: Goal: Knowledge of the prescribed therapeutic regimen will improve Outcome: Progressing   Problem: Pain Management: Goal: Pain level will decrease with appropriate interventions Outcome: Progressing   Problem: Activity: Goal: Risk for activity intolerance will decrease Outcome: Progressing   

## 2021-05-22 NOTE — Progress Notes (Signed)
Physical Therapy Treatment ?Patient Details ?Name: Shawn Harris ?MRN: [id number] ?DOB: May 09, 1950 ?Today's Date: 05/22/2021 ? ? ?History of Present Illness Patient is 71 y.o. male s/p Rt TKR for excision of infected TKA with placement antibiotic spacer. PMH signficant for OA, HTN, hemochromatosis, Rt TKA initialy on 02/21/20. ? ?  ?PT Comments  ? ? Pt ambulates 300 ft with RW, decreased cadence, decreased R knee flexion in swing, decreased RLE stance time, good steadiness without R knee buckling noted, pt denies increase in pain with gait distance, able to respect SB status appropriately. Once returning to room, cued pt through written/illustrated HEP with good quad activation noted. Pt denies use of stairs to enter/exit home and declines practicing. All questions answered and educated pt and spouse on safety with mobility. Pt motivated to return home with good family support after afternoon antibiotics. ?  ?Recommendations for follow up therapy are one component of a multi-disciplinary discharge planning process, led by the attending physician.  Recommendations may be updated based on patient status, additional functional criteria and insurance authorization. ? ?Follow Up Recommendations ? Follow physician's recommendations for discharge plan and follow up therapies ?  ?  ?Assistance Recommended at Discharge Intermittent Supervision/Assistance  ?Patient can return home with the following A little help with walking and/or transfers;A little help with bathing/dressing/bathroom;Assistance with cooking/housework;Direct supervision/assist for medications management;Assist for transportation ?  ?Equipment Recommendations ? Rolling walker (2 wheels)  ?  ?Recommendations for Other Services   ? ? ?  ?Precautions / Restrictions Precautions ?Precautions: Fall ?Restrictions ?Weight Bearing Restrictions: Yes ?RLE Weight Bearing: Partial weight bearing ?RLE Partial Weight Bearing Percentage or Pounds: 50%  ?  ? ?Mobility ? Bed  Mobility ?  ?General bed mobility comments: in recliner upon arrival ?  ? ?Transfers ?Overall transfer level: Needs assistance ?Equipment used: Rolling walker (2 wheels) ?Transfers: Sit to/from Stand ?Sit to Stand: Supervision ? General transfer comment: powers to stand with supv for safety, RLE extended, good strength in BUE to assist in rising ?  ? ?Ambulation/Gait ?Ambulation/Gait assistance: Supervision ?Gait Distance (Feet): 300 Feet ?Assistive device: Rolling walker (2 wheels) ?Gait Pattern/deviations: Step-to pattern, Decreased stride length, Decreased weight shift to right, Decreased stance time - right ?Gait velocity: decreased ? General Gait Details: step to pattern with decreased RLE stance time and weight shift, decreased R knee flexion in swing, slow cadence without LOB, no knee unsteadiness noted and declines KI use, able to use BUE to assist with PWB  status ? ? ?Stairs ?  ?  ?  ?  ?  ? ? ?Wheelchair Mobility ?  ? ?Modified Rankin (Stroke Patients Only) ?  ? ? ?  ?Balance Overall balance assessment: Needs assistance ? Standing balance support: During functional activity, Bilateral upper extremity supported, Reliant on assistive device for balance ?Standing balance-Leahy Scale: Poor ?   ?  ?  ? ?  ?Cognition Arousal/Alertness: Awake/alert ?Behavior During Therapy: [facility] for tasks assessed/performed ?Overall Cognitive Status: Within Functional Limits for tasks assessed ?   ?  ?  ?  ?  ?  ? ?  ?Exercises Total Joint Exercises ?Ankle Circles/Pumps: AROM, Both, 15 reps ?Quad Sets: Seated, AROM, Strengthening, Both, 10 reps ?Hip ABduction/ADduction: Seated, AAROM, Strengthening, Right, 5 reps ?Long Arc Quad: Seated, AAROM, Strengthening, Right, 5 reps ? ?  ?General Comments   ?  ?  ? ?Pertinent Vitals/Pain Pain Assessment ?Pain Assessment: 0-10 ?Pain Score: 4  ?Pain Location: Rt knee ?Pain Descriptors / Indicators: Discomfort, Aching, Sore ?Pain Intervention(s): Limited  activity within patient's tolerance,  Monitored during session, Premedicated before session, Repositioned, Ice applied  ? ? ?Home Living   ?  ?  ?  ?  ?  ?  ?  ?  ?  ?   ?  ?Prior Function    ?  ?  ?   ? ?PT Goals (current goals can now be found in the care plan section) Acute Rehab PT Goals ?Patient Stated Goal: get knee better ?PT Goal Formulation: With patient ?Time For Goal Achievement: 05/28/21 ?Potential to Achieve Goals: Good ?Progress towards PT goals: Progressing toward goals ? ?  ?Frequency ? ? ? 7X/week ? ? ? ?  ?PT Plan Current plan remains appropriate  ? ? ?Co-evaluation   ?  ?  ?  ?  ? ?  ?AM-PAC PT "6 Clicks" Mobility   ?Outcome Measure ? Help needed turning from your back to your side while in a flat bed without using bedrails?: A Little ?Help needed moving from lying on your back to sitting on the side of a flat bed without using bedrails?: A Little ?Help needed moving to and from a bed to a chair (including a wheelchair)?: A Little ?Help needed standing up from a chair using your arms (e.g., wheelchair or bedside chair)?: A Little ?Help needed to walk in hospital room?: A Little ?Help needed climbing 3-5 steps with a railing? : A Little ?6 Click Score: 18 ? ?  ?End of Session Equipment Utilized During Treatment: Gait belt ?Activity Tolerance: Patient tolerated treatment well ?Patient left: in chair;with call bell/phone within reach;with chair alarm set;with family/visitor present ?Nurse Communication: Mobility status ?PT Visit Diagnosis: Muscle weakness (generalized) (M62.81);Difficulty in walking, not elsewhere classified (R26.2) ?  ? ? ?Time: 2025-4270 ?PT Time Calculation (min) (ACUTE ONLY): 28 min ? ?Charges:  $Gait Training: 8-22 mins ?$Therapeutic Exercise: 8-22 mins          ?          ? ? ?Domenick Bookbinder PT, DPT ?05/22/21, 12:46 PM ? ? ? ?

## 2021-05-24 ENCOUNTER — Telehealth: Payer: Self-pay | Admitting: *Deleted

## 2021-05-24 NOTE — Telephone Encounter (Signed)
Patient wife called reporting that patient had knee surgery last week and was discharged home with prescription for Ferrous Sulfate 325 mg to take twice a day. This patient has Hemochromatosis and she is asking if he can take this. Please advise ? ?Component Ref Range & Units 2 d ago ?(05/22/21) 3 d ago ?(05/21/21) 7 d ago ?(05/17/21) 9 mo ago ?(08/11/20) 1 yr ago ?(02/21/20) 1 yr ago ?(02/18/20) 4 yr ago ?(04/11/17)  ?WBC 4.0 - 10.5 K/uL 12.3 High   13.2 High   7.7  5.7   7.3  5.3 R   ?RBC 4.22 - 5.81 MIL/uL 4.73  5.23  6.80 High   4.73   5.30  5.14 R   ?Hemoglobin 13.0 - 17.0 g/dL 03.5 Low   46.5  68.1 High   14.1  16.0  16.0  16.0 R   ?HCT 39.0 - 52.0 % 38.3 Low   42.2  54.2 High   41.4  47.0  46.2  45.3 R   ?MCV 80.0 - 100.0 fL 81.0  80.7  79.7 Low   87.5   87.2  88.0   ?MCH 26.0 - 34.0 pg 26.0  26.6  26.2  29.8   30.2  31.1   ?MCHC 30.0 - 36.0 g/dL 27.5  17.0  01.7  49.4   34.6  35.3 R   ?RDW 11.5 - 15.5 % 15.9 High   15.6 High   17.3 High   14.8   13.4  13.2 R   ?Platelets 150 - 400 K/uL 250  299  355  268   274  243 R   ?nRBC 0.0 - 0.2 % 0.0  0.0 CM  0.0 CM  0.0   0.0 CM    ?Comment: Performed at [facility], [address] [place], [place] [address]  ?Neutrophils Relative %     57 R    54 R   ?Basophils Absolute     0.0 R    0.1 R, CM   ?Immature Granulocytes     0 R      ?Abs Immature Granulocytes     0.02 R, CM      ?Sodium      137 R     ?Potassium      3.2 Low  R     ?Chloride      100 R     ?BUN      19 R     ?Creatinine, Ser      0.80 R     ?Glucose, Bld      79 R, CM     ?Calcium, Ion      1.13 Low  R     ?Neutro Abs     3.3 R    2.9 R   ?TCO2      25 R     ?Lymphocytes Relative     31 R    30 R   ?Lymphs Abs     1.8 R    1.6 R   ?Monocytes Relative     9 R    10 R   ?Monocytes Absolute     0.5 R    0.5 R   ?Eosinophils Relative     2 R    5 R   ?Eosinophils Absolute     0.1 R    0.3 R   ?Basophils Relative     1 R    1 R   ?Resulting  Agency  [facility]  ?  ? ?  ?  ?Specimen Collected: 05/22/21 03:35 Last Resulted: 05/22/21 04:10  ?  ?  ? ?

## 2021-05-24 NOTE — Telephone Encounter (Signed)
Call returned to patient and informed of Dr Milinda Cave response that he should be ok not to take iron. Patient repeated back to me ? ?  11:14 AM ?Although his hemoglobin dropped, it is still 12.3.  He is probably okay not taking it.  ?

## 2021-05-25 ENCOUNTER — Encounter (HOSPITAL_COMMUNITY): Payer: Self-pay | Admitting: Orthopedic Surgery

## 2021-06-11 ENCOUNTER — Other Ambulatory Visit: Payer: Self-pay

## 2021-06-11 ENCOUNTER — Telehealth: Payer: Self-pay

## 2021-06-11 ENCOUNTER — Encounter: Payer: Self-pay | Admitting: Internal Medicine

## 2021-06-11 ENCOUNTER — Telehealth (INDEPENDENT_AMBULATORY_CARE_PROVIDER_SITE_OTHER): Payer: Medicare HMO | Admitting: Internal Medicine

## 2021-06-11 DIAGNOSIS — Z452 Encounter for adjustment and management of vascular access device: Secondary | ICD-10-CM | POA: Diagnosis not present

## 2021-06-11 DIAGNOSIS — T8453XA Infection and inflammatory reaction due to internal right knee prosthesis, initial encounter: Secondary | ICD-10-CM | POA: Diagnosis not present

## 2021-06-11 NOTE — Assessment & Plan Note (Signed)
No issues with PICC line and will plan to have this removed following therapy on 6/15.

## 2021-06-11 NOTE — Telephone Encounter (Signed)
Per Dr. Earlene Plater called [facility] with orders to pull picc after last dose. Spoke with Lyla who was able to take to verbal order.  Order was repeated/confirmed before ending call. P: [phone] Juanita Laster, RMA

## 2021-06-11 NOTE — Assessment & Plan Note (Signed)
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  Plan is for 6 weeks fo IV antibiotics with daptomycin through 6/15.  Discussed with patient that following this antibiotic course will likely have a antibiotic holiday followed by labs and sampling of joint space prior to proceeding with 2nd stage procedure.  He will continue to follow up with orthopedics and I will see him again in 4-6 weeks for clinical re-evaluation.

## 2021-06-11 NOTE — Progress Notes (Signed)
[facility]  CHIEF COMPLAINT:    Follow up for PJI  SUBJECTIVE:    Shawn Harris is a 71 y.o. male with PMHx as below who presents to the clinic for PJI.   Patient underwent total right knee arthroplasty in February 2022.  This was uneventful until about 3 months following surgery when he had developed persistent pain and effusions as well as a pocket of fluid.  He reports previously being given Celebrex for pain control but no antibiotics and no sampling of joint fluid was obtained.  He received a second opinion with Dr. Alvan Dame at [facility] and was found to have mild warmth and effusion.  He had an aspiration of the knee which revealed a white blood cell count of 19,000 with neutrophil predominance.  Cultures were also positive for methicillin-resistant Staphylococcus epidermidis.  He was brought to the operating room on 5/4 for excisional total knee arthroplasty and antibiotic spacer placement in preparation for two-stage exchange.  He was admitted at [facility] for this from 5/4-5/6.  PICC line was placed and he was discharged on Daptomycin $RemoveBefor'700mg'DalxelycCivM$  IV daily through 07/01/21 to complete 6 weeks.  He is tolerating this well so far.  His baseline ESR/CRP was 25 and 7.1 (normal < 1.0) respectively.    He reports no issues with his PICC line.  He is doing well and pain improving daily.  He saw Dr Alvan Dame 5/17 and reports that he was pleased with his progress.  Recent OPAT labs from 5/22 show ESR 12, CRP 15.  Please see A&P for the details of today's visit and status of the patient's medical problems.   Patient's Medications  New Prescriptions   No medications on file  Previous Medications   ACETAMINOPHEN (TYLENOL) 500 MG TABLET    Take 2 tablets (1,000 mg total) by mouth every 8 (eight) hours.   ASPIRIN 81 MG CHEWABLE TABLET    Chew 1 tablet (81 mg total) by mouth 2 (two) times daily.   CELECOXIB (CELEBREX) 200 MG CAPSULE    Take 1 capsule (200 mg total) by mouth 2  (two) times daily.   DAPTOMYCIN (CUBICIN) IVPB    Inject 700 mg into the vein daily. Indication:  MRSE R-knee PJI First Dose: Yes Last Day of Therapy:  07/01/21 Labs - Once weekly:  CBC/D, BMP, and CPK Labs - Every other week:  ESR and CRP Method of administration: IV Push Method of administration may be changed at the discretion of home infusion pharmacist based upon assessment of the patient and/or caregiver's ability to self-administer the medication ordered.   DOCUSATE SODIUM (COLACE) 100 MG CAPSULE    Take 1 capsule (100 mg total) by mouth 2 (two) times daily.   FERROUS SULFATE 325 (65 FE) MG TABLET    Take 1 tablet (325 mg total) by mouth 2 (two) times daily with a meal.   METHOCARBAMOL (ROBAXIN) 500 MG TABLET    Take 1 tablet (500 mg total) by mouth every 6 (six) hours as needed for muscle spasms.   OXYCODONE (OXY IR/ROXICODONE) 5 MG IMMEDIATE RELEASE TABLET    Take 1-2 tablets (5-10 mg total) by mouth every 4 (four) hours as needed for severe pain or moderate pain (pain).   POLYETHYLENE GLYCOL (MIRALAX / GLYCOLAX) 17 G PACKET    Take 17 g by mouth 2 (two) times daily as needed.   TESTOSTERONE CYPIONATE (DEPOTESTOSTERONE CYPIONATE) 200 MG/ML INJECTION    Inject 200 mg  into the muscle every 14 (fourteen) days.   VALSARTAN-HYDROCHLOROTHIAZIDE (DIOVAN-HCT) 160-25 MG TABLET    Take 1 tablet by mouth daily.  Modified Medications   No medications on file  Discontinued Medications   No medications on file      Past Medical History:  Diagnosis Date   Arthritis    Hemochromatosis    Hypertension     Social History   Tobacco Use   Smoking status: Never   Smokeless tobacco: Former    Quit date: 03/10/2015  Vaping Use   Vaping Use: Never used    Family History  Problem Relation Age of Onset   Cirrhosis Mother    Hemachromatosis Mother    Coronary artery disease Father    Dementia Father    Prostate cancer Father    Arthritis Sister    Pancreatic disease Brother     No Known  Allergies  Review of Systems  Constitutional: Negative.   Respiratory: Negative.    Cardiovascular: Negative.   Gastrointestinal: Negative.   Musculoskeletal: Negative.   All other systems reviewed and are negative.   OBJECTIVE:      Physical Exam Constitutional:      General: He is not in acute distress.    Appearance: Normal appearance.  HENT:     Head: Normocephalic and atraumatic.  Eyes:     Extraocular Movements: Extraocular movements intact.     Conjunctiva/sclera: Conjunctivae normal.  Pulmonary:     Effort: Pulmonary effort is normal. No respiratory distress.  Neurological:     General: No focal deficit present.     Mental Status: He is alert and oriented to person, place, and time.  Psychiatric:        Mood and Affect: Mood normal.        Behavior: Behavior normal.     Labs and Microbiology:    Latest Ref Rng & Units 05/22/2021    3:35 AM 05/21/2021    2:57 AM 05/17/2021    8:53 AM  CBC  WBC 4.0 - 10.5 K/uL 12.3   13.2   7.7    Hemoglobin 13.0 - 17.0 g/dL 12.3   13.9   17.8    Hematocrit 39.0 - 52.0 % 38.3   42.2   54.2    Platelets 150 - 400 K/uL 250   299   355        Latest Ref Rng & Units 05/21/2021    2:57 AM 05/17/2021    8:53 AM 02/21/2020    6:34 AM  CMP  Glucose 70 - 99 mg/dL 161   102   79    BUN 8 - 23 mg/dL $Remove'17   19   19    'xNVSaZT$ Creatinine 0.61 - 1.24 mg/dL 0.84   0.92   0.80    Sodium 135 - 145 mmol/L 136   134   137    Potassium 3.5 - 5.1 mmol/L 3.6   3.4   3.2    Chloride 98 - 111 mmol/L 102   100   100    CO2 22 - 32 mmol/L 25   26     Calcium 8.9 - 10.3 mg/dL 8.1   8.9         ASSESSMENT & PLAN:    Infection of prosthetic right knee joint (HCC) Patient is about 3 weeks removed from surgery for a 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  Plan is for  6 weeks fo IV antibiotics with daptomycin through 6/15.  Discussed with patient that following this antibiotic course will  likely have a antibiotic holiday followed by labs and sampling of joint space prior to proceeding with 2nd stage procedure.  He will continue to follow up with orthopedics and I will see him again in 4-6 weeks for clinical re-evaluation.  Peripherally inserted central catheter (PICC) in place No issues with PICC line and will plan to have this removed following therapy on 6/15.        [facility] 06/11/2021, 2:44 PM   Virtual Visit via Video Note   I connected with Shawn Harris on 06/11/21 at 2:44 PM by video and verified that I am speaking with the correct person using two identifiers.   I discussed the limitations, risks, security and privacy concerns of performing an evaluation and management service by telephone and the availability of in person appointments. I also discussed with the patient that there may be a patient responsible charge related to this service. The patient expressed understanding and agreed to proceed.  Patient location: home My location: rcid Duration of call: 8 min.

## 2021-06-15 NOTE — Telephone Encounter (Signed)
Thank you :)

## 2021-06-30 ENCOUNTER — Telehealth: Payer: Self-pay

## 2021-06-30 NOTE — Telephone Encounter (Signed)
Verbal orders given to [facility] with [facility] to pass to the pharmacy team to remove patient's picc line after last dose on 07/01/21 per Dr. Earlene Plater. Shawn Harris

## 2021-06-30 NOTE — Telephone Encounter (Signed)
Patient's wife calling to confirm patient is to have picc removed tomorrow after his last dose. Per office note last dose 07/01/21. I do not see any pull picc orders. Please advise and I will give order to [facility].  Jamorian Dimaria T Pricilla Loveless

## 2021-07-01 NOTE — Telephone Encounter (Signed)
Thank you :)

## 2021-07-02 NOTE — Telephone Encounter (Signed)
Received voicemail from Maralyn Sago, Charity fundraiser with [facility], requesting pull PICC orders.   Called Sarah back and relayed that okay to pull PICC after last dose per Dr. Earlene Plater. Maralyn Sago states that she already removed the PICC line yesterday 6/15 "in good faith."  Maralyn Sago phone: [phone]  Sandie Ano, RN

## 2021-07-23 ENCOUNTER — Other Ambulatory Visit: Payer: Self-pay

## 2021-07-23 ENCOUNTER — Telehealth (INDEPENDENT_AMBULATORY_CARE_PROVIDER_SITE_OTHER): Payer: Medicare HMO | Admitting: Internal Medicine

## 2021-07-23 DIAGNOSIS — T8453XA Infection and inflammatory reaction due to internal right knee prosthesis, initial encounter: Secondary | ICD-10-CM

## 2021-07-23 DIAGNOSIS — Z452 Encounter for adjustment and management of vascular access device: Secondary | ICD-10-CM | POA: Diagnosis not present

## 2021-07-23 NOTE — Assessment & Plan Note (Signed)
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  He completed antibiotics on 07/01/21 and will tentatively return to the OR in mid-August hopefully for reimplantation if he remains free of any infectious concerns.  He will follow up with [place] as needed at this time but we remains available as needed for him or his surgery team.

## 2021-07-23 NOTE — Progress Notes (Signed)
      [facility]  CHIEF COMPLAINT:    Follow up for PJI  SUBJECTIVE:    Shawn Harris is a 71 y.o. male with PMHx as below who presents to the clinic for PJI.   Patient here today for virtual visit routine follow up.  He underwent total right knee arthroplasty in February 2022.  This was uneventful until about 3 months following surgery when he had developed persistent pain and effusions as well as a pocket of fluid.  He reports previously being given Celebrex for pain control but no antibiotics and no sampling of joint fluid was obtained.  He received a second opinion with Dr. Olin at [facility] and was found to have mild warmth and effusion.  He had an aspiration of the knee which revealed a white blood cell count of 19,000 with neutrophil predominance.  Cultures were also positive for methicillin-resistant Staphylococcus epidermidis.  He was brought to the operating room on 5/4 for excisional total knee arthroplasty and antibiotic spacer placement in preparation for two-stage exchange.  He was admitted at [facility] for this from 5/4-5/6.  PICC line was placed and he was discharged on Daptomycin 700mg IV daily through 07/01/21 to complete 6 weeks.  He completed this antibiotic course without issues and had PICC line removed at that time.  He has continued to follow up with Dr Olin and saw him on 06/30/21 where they discussed next steps including antibiotic holiday followed by either re-implantation or repeat I&D and another spacer placement.  He is scheduled for this procedure on 08/30/21.  His ESR and CRP on 07/13/21 (viewable via [facility]) were normalized. He continues to feel well with no concerns following his antibiotic course.  Please see A&P for the details of today's visit and status of the patient's medical problems.   Patient's Medications  New Prescriptions   No medications on file  Previous Medications   ACETAMINOPHEN (TYLENOL) 500 MG TABLET    Take 2  tablets (1,000 mg total) by mouth every 8 (eight) hours.   DOCUSATE SODIUM (COLACE) 100 MG CAPSULE    Take 1 capsule (100 mg total) by mouth 2 (two) times daily.   FERROUS SULFATE 325 (65 FE) MG TABLET    Take 1 tablet (325 mg total) by mouth 2 (two) times daily with a meal.   METHOCARBAMOL (ROBAXIN) 500 MG TABLET    Take 1 tablet (500 mg total) by mouth every 6 (six) hours as needed for muscle spasms.   OXYCODONE (OXY IR/ROXICODONE) 5 MG IMMEDIATE RELEASE TABLET    Take 1-2 tablets (5-10 mg total) by mouth every 4 (four) hours as needed for severe pain or moderate pain (pain).   POLYETHYLENE GLYCOL (MIRALAX / GLYCOLAX) 17 G PACKET    Take 17 g by mouth 2 (two) times daily as needed.   TESTOSTERONE CYPIONATE (DEPOTESTOSTERONE CYPIONATE) 200 MG/ML INJECTION    Inject 200 mg into the muscle every 14 (fourteen) days.   VALSARTAN-HYDROCHLOROTHIAZIDE (DIOVAN-HCT) 160-25 MG TABLET    Take 1 tablet by mouth daily.  Modified Medications   No medications on file  Discontinued Medications   No medications on file      Past Medical History:  Diagnosis Date   Arthritis    Hemochromatosis    Hypertension     Social History   Tobacco Use   Smoking status: Never   Smokeless tobacco: Former    Quit date: 03/10/2015  Vaping Use   Vaping Use:   Never used    Family History  Problem Relation Age of Onset   Cirrhosis Mother    Hemachromatosis Mother    Coronary artery disease Father    Dementia Father    Prostate cancer Father    Arthritis Sister    Pancreatic disease Brother     No Known Allergies  Review of Systems  Constitutional: Negative.   Musculoskeletal: Negative.      OBJECTIVE:      Physical Exam He is doing well.  Appears and sounds well today.  Labs and Microbiology:    Latest Ref Rng & Units 05/22/2021    3:35 AM 05/21/2021    2:57 AM 05/17/2021    8:53 AM  CBC  WBC 4.0 - 10.5 K/uL 12.3  13.2  7.7   Hemoglobin 13.0 - 17.0 g/dL 12.3  13.9  17.8   Hematocrit 39.0 -  52.0 % 38.3  42.2  54.2   Platelets 150 - 400 K/uL 250  299  355       Latest Ref Rng & Units 05/21/2021    2:57 AM 05/17/2021    8:53 AM 02/21/2020    6:34 AM  CMP  Glucose 70 - 99 mg/dL 161  102  79   BUN 8 - 23 mg/dL 17  19  19   Creatinine 0.61 - 1.24 mg/dL 0.84  0.92  0.80   Sodium 135 - 145 mmol/L 136  134  137   Potassium 3.5 - 5.1 mmol/L 3.6  3.4  3.2   Chloride 98 - 111 mmol/L 102  100  100   CO2 22 - 32 mmol/L 25  26    Calcium 8.9 - 10.3 mg/dL 8.1  8.9         ASSESSMENT & PLAN:    Infection of prosthetic right knee joint (HCC) Patient is doing well after 6 weeks of IV antibiotics for 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  He completed antibiotics on 07/01/21 and will tentatively return to the OR in mid-August hopefully for reimplantation if he remains free of any infectious concerns.  He will follow up with us as needed at this time but we remains available as needed for him or his surgery team.  Peripherally inserted central catheter (PICC) in place This has been removed.      [facility] 07/23/2021, 3:00 PM   Virtual Visit via Telephone Note   I connected with Shawn Harris on 07/23/21 at 3:00 PM by telephone and verified that I am speaking with the correct person using two identifiers.   I discussed the limitations, risks, security and privacy concerns of performing an evaluation and management service by telephone and the availability of in person appointments. I also discussed with the patient that there may be a patient responsible charge related to this service. The patient expressed understanding and agreed to proceed.  Patient location: home My location: rcid Duration of call: 6 min. Time in chart today 21 min  

## 2021-07-23 NOTE — Assessment & Plan Note (Signed)
This has been removed.

## 2021-08-11 NOTE — Progress Notes (Signed)
Sent message, via epic in basket, requesting order in epic from surgeon     08/11/21 0945  Preop Orders  Has preop orders? No  Name of staff/physician contacted for orders(Indicate phone or IB message) A. Domenic Schwab, PA-C.

## 2021-08-12 ENCOUNTER — Other Ambulatory Visit: Payer: Medicare HMO

## 2021-08-13 ENCOUNTER — Ambulatory Visit: Payer: Medicare HMO | Admitting: Oncology

## 2021-08-16 NOTE — Progress Notes (Signed)
Anesthesia Review:  PCP: DR Jerl Mina Preop clearance on 08/04/2021  Cardiologist : Chest x-ray : EKG :5/ 4/23  Echo : Stress test: Cardiac Cath :  Activity level:  Sleep Study/ CPAP : Fasting Blood Sugar :      / Checks Blood Sugar -- times a day:   Blood Thinner/ Instructions /Last Dose: ASA / Instructions/ Last Dose :   81 mg aspirin  08/04/2021- cbc. Cmp and u/a

## 2021-08-16 NOTE — Patient Instructions (Signed)
SURGICAL WAITING ROOM VISITATION Patients having surgery or a procedure may have no more than 2 support people in the waiting area - these visitors may rotate.   Children under the age of 25 must have an adult with them who is not the patient. If the patient needs to stay at the hospital during part of their recovery, the visitor guidelines for inpatient rooms apply. Pre-op nurse will coordinate an appropriate time for 1 support person to accompany patient in pre-op.  This support person may not rotate.    Please refer to the [facility] website for the visitor guidelines for Inpatients (after your surgery is over and you are in a regular room).       Your procedure is scheduled on: 08/30/2021    Report to [facility] Main Entrance    Report to admitting at   1045AM   Call this number if you have problems the morning of surgery [phone]   Do not eat food :After Midnight.   After Midnight you may have the following liquids until __1015____ AM  DAY OF SURGERY  Water Non-Citrus Juices (without pulp, NO RED) Carbonated Beverages Black Coffee (NO MILK/CREAM OR CREAMERS, sugar ok)  Clear Tea (NO MILK/CREAM OR CREAMERS, sugar ok) regular and decaf                             Plain Jell-O (NO RED)                                           Fruit ices (not with fruit pulp, NO RED)                                     Popsicles (NO RED)                                                               Sports drinks like Gatorade (NO RED)              .       The day of surgery:  Drink ONE (1) Pre-Surgery Clear Ensure or G2 at AM the morning of surgery. Drink in one sitting. Do not sip.  This drink was given to you during your hospital  pre-op appointment visit. Nothing else to drink after completing the  Pre-Surgery Clear Ensure or G2.          If you have questions, please contact your surgeon's office.       Oral Hygiene is also important to reduce your risk of infection.                                     Remember - BRUSH YOUR TEETH THE MORNING OF SURGERY WITH YOUR REGULAR TOOTHPASTE   Do NOT smoke after Midnight   Take these medicines the morning of surgery with A SIP OF WATER:  none   DO NOT TAKE ANY ORAL DIABETIC MEDICATIONS DAY OF YOUR SURGERY  Bring CPAP mask and tubing day of surgery.                              You may not have any metal on your body including hair pins, jewelry, and body piercing             Do not wear make-up, lotions, powders, perfumes/cologne, or deodorant  Do not wear nail polish including gel and S&S, artificial/acrylic nails, or any other type of covering on natural nails including finger and toenails. If you have artificial nails, gel coating, etc. that needs to be removed by a nail salon please have this removed prior to surgery or surgery may need to be canceled/ delayed if the surgeon/ anesthesia feels like they are unable to be safely monitored.   Do not shave  48 hours prior to surgery.               Men may shave face and neck.   Do not bring valuables to the hospital. [place].   Contacts, dentures or bridgework may not be worn into surgery.   Bring small overnight bag day of surgery.   DO NOT [place]. PHARMACY WILL DISPENSE MEDICATIONS LISTED ON YOUR MEDICATION LIST TO YOU DURING YOUR ADMISSION [place]!    Patients discharged on the day of surgery will not be allowed to drive home.  Someone NEEDS to stay with you for the first 24 hours after anesthesia.   Special Instructions: Bring a copy of your healthcare power of attorney and living will documents         the day of surgery if you haven't scanned them before.              Please read over the following fact sheets you were given: IF YOU HAVE QUESTIONS ABOUT YOUR PRE-OP INSTRUCTIONS PLEASE CALL [phone]     [facility] - Preparing for Surgery Before  surgery, you can play an important role.  Because skin is not sterile, your skin needs to be as free of germs as possible.  You can reduce the number of germs on your skin by washing with CHG (chlorahexidine gluconate) soap before surgery.  CHG is an antiseptic cleaner which kills germs and bonds with the skin to continue killing germs even after washing. Please DO NOT use if you have an allergy to CHG or antibacterial soaps.  If your skin becomes reddened/irritated stop using the CHG and inform your nurse when you arrive at Short Stay. Do not shave (including legs and underarms) for at least 48 hours prior to the first CHG shower.  You may shave your face/neck. Please follow these instructions carefully:  1.  Shower with CHG Soap the night before surgery and the  morning of Surgery.  2.  If you choose to wash your hair, wash your hair first as usual with your  normal  shampoo.  3.  After you shampoo, rinse your hair and body thoroughly to remove the  shampoo.                           4.  Use CHG as you would any other liquid soap.  You can apply chg directly  to the skin and wash  Gently with a scrungie or clean washcloth.  5.  Apply the CHG Soap to your body ONLY FROM THE NECK DOWN.   Do not use on face/ open                           Wound or open sores. Avoid contact with eyes, ears mouth and genitals (private parts).                       Wash face,  Genitals (private parts) with your normal soap.             6.  Wash thoroughly, paying special attention to the area where your surgery  will be performed.  7.  Thoroughly rinse your body with warm water from the neck down.  8.  DO NOT shower/wash with your normal soap after using and rinsing off  the CHG Soap.                9.  Pat yourself dry with a clean towel.            10.  Wear clean pajamas.            11.  Place clean sheets on your bed the night of your first shower and do not  sleep with pets. Day of Surgery  : Do not apply any lotions/deodorants the morning of surgery.  Please wear clean clothes to the hospital/surgery center.  FAILURE TO FOLLOW THESE INSTRUCTIONS MAY RESULT IN THE CANCELLATION OF YOUR SURGERY PATIENT SIGNATURE_________________________________  NURSE SIGNATURE__________________________________  ________________________________________________________________________

## 2021-08-18 ENCOUNTER — Other Ambulatory Visit: Payer: Self-pay

## 2021-08-18 ENCOUNTER — Encounter (HOSPITAL_COMMUNITY)
Admission: RE | Admit: 2021-08-18 | Discharge: 2021-08-18 | Disposition: A | Discharge status: Home / Self Care | Payer: Medicare HMO | Source: Ambulatory Visit | Attending: Orthopedic Surgery | Admitting: Orthopedic Surgery

## 2021-08-18 ENCOUNTER — Encounter (HOSPITAL_COMMUNITY): Payer: Self-pay

## 2021-08-18 VITALS — BP 106/82 | HR 97 | Temp 98.2°F | Resp 16 | Ht 72.0 in | Wt 238.0 lb

## 2021-08-18 DIAGNOSIS — X58XXXA Exposure to other specified factors, initial encounter: Secondary | ICD-10-CM | POA: Diagnosis not present

## 2021-08-18 DIAGNOSIS — Z01812 Encounter for preprocedural laboratory examination: Secondary | ICD-10-CM | POA: Diagnosis present

## 2021-08-18 DIAGNOSIS — T8453XA Infection and inflammatory reaction due to internal right knee prosthesis, initial encounter: Secondary | ICD-10-CM | POA: Insufficient documentation

## 2021-08-18 DIAGNOSIS — Z01818 Encounter for other preprocedural examination: Secondary | ICD-10-CM

## 2021-08-18 LAB — TYPE AND SCREEN
ABO/RH(D): O POS
Antibody Screen: NEGATIVE

## 2021-08-30 ENCOUNTER — Encounter (HOSPITAL_COMMUNITY): Payer: Self-pay | Admitting: Orthopedic Surgery

## 2021-08-30 ENCOUNTER — Inpatient Hospital Stay (HOSPITAL_COMMUNITY): Payer: Medicare HMO | Admitting: Certified Registered"

## 2021-08-30 ENCOUNTER — Inpatient Hospital Stay (HOSPITAL_COMMUNITY)
Admission: RE | Admit: 2021-08-30 | Discharge: 2021-08-31 | DRG: 467 | Disposition: A | Discharge status: Home / Self Care | Payer: Medicare HMO | Attending: Orthopedic Surgery | Admitting: Orthopedic Surgery

## 2021-08-30 ENCOUNTER — Other Ambulatory Visit: Payer: Self-pay

## 2021-08-30 ENCOUNTER — Encounter (HOSPITAL_COMMUNITY)
Admission: RE | Disposition: A | Discharge status: Home / Self Care | Payer: Self-pay | Source: Home / Self Care | Attending: Orthopedic Surgery

## 2021-08-30 ENCOUNTER — Inpatient Hospital Stay (HOSPITAL_COMMUNITY): Payer: Medicare HMO | Admitting: Physician Assistant

## 2021-08-30 DIAGNOSIS — T8453XD Infection and inflammatory reaction due to internal right knee prosthesis, subsequent encounter: Principal | ICD-10-CM

## 2021-08-30 DIAGNOSIS — Z96651 Presence of right artificial knee joint: Secondary | ICD-10-CM

## 2021-08-30 DIAGNOSIS — Z87891 Personal history of nicotine dependence: Secondary | ICD-10-CM

## 2021-08-30 DIAGNOSIS — Z8042 Family history of malignant neoplasm of prostate: Secondary | ICD-10-CM | POA: Diagnosis not present

## 2021-08-30 DIAGNOSIS — Z7982 Long term (current) use of aspirin: Secondary | ICD-10-CM | POA: Diagnosis not present

## 2021-08-30 DIAGNOSIS — M1990 Unspecified osteoarthritis, unspecified site: Secondary | ICD-10-CM | POA: Diagnosis present

## 2021-08-30 DIAGNOSIS — T849XXA Unspecified complication of internal orthopedic prosthetic device, implant and graft, initial encounter: Secondary | ICD-10-CM

## 2021-08-30 DIAGNOSIS — Z4733 Aftercare following explantation of knee joint prosthesis: Principal | ICD-10-CM

## 2021-08-30 DIAGNOSIS — Z8249 Family history of ischemic heart disease and other diseases of the circulatory system: Secondary | ICD-10-CM | POA: Diagnosis not present

## 2021-08-30 DIAGNOSIS — Y793 Surgical instruments, materials and orthopedic devices (including sutures) associated with adverse incidents: Secondary | ICD-10-CM | POA: Diagnosis present

## 2021-08-30 DIAGNOSIS — Z8261 Family history of arthritis: Secondary | ICD-10-CM

## 2021-08-30 DIAGNOSIS — I10 Essential (primary) hypertension: Secondary | ICD-10-CM | POA: Diagnosis present

## 2021-08-30 DIAGNOSIS — Z01818 Encounter for other preprocedural examination: Secondary | ICD-10-CM

## 2021-08-30 DIAGNOSIS — Z79899 Other long term (current) drug therapy: Secondary | ICD-10-CM | POA: Diagnosis not present

## 2021-08-30 DIAGNOSIS — T8453XA Infection and inflammatory reaction due to internal right knee prosthesis, initial encounter: Secondary | ICD-10-CM | POA: Diagnosis present

## 2021-08-30 HISTORY — PX: REIMPLANTATION OF TOTAL KNEE: SHX6052

## 2021-08-30 LAB — SURGICAL PCR SCREEN
MRSA, PCR: NEGATIVE
Staphylococcus aureus: NEGATIVE

## 2021-08-30 SURGERY — REVISION, TOTAL ARTHROPLASTY, KNEE
Anesthesia: Regional | Site: Knee | Laterality: Right

## 2021-08-30 MED ORDER — TOBRAMYCIN SULFATE 1.2 G IJ SOLR
INTRAMUSCULAR | Status: AC
  Filled 2021-08-30: qty 1.2

## 2021-08-30 MED ORDER — CEFAZOLIN SODIUM-DEXTROSE 2-4 GM/100ML-% IV SOLN
2.0000 g | INTRAVENOUS | Status: AC
  Administered 2021-08-30: 2 g via INTRAVENOUS
  Filled 2021-08-30: qty 100

## 2021-08-30 MED ORDER — BUPIVACAINE-EPINEPHRINE (PF) 0.5% -1:200000 IJ SOLN
INTRAMUSCULAR | Status: AC
  Filled 2021-08-30: qty 30

## 2021-08-30 MED ORDER — ONDANSETRON HCL 4 MG PO TABS: 4.0000 mg | ORAL_TABLET | Freq: Four times a day (QID) | ORAL | Status: DC | PRN

## 2021-08-30 MED ORDER — HYDROMORPHONE HCL 1 MG/ML IJ SOLN: 0.2500 mg | INTRAMUSCULAR | Status: DC | PRN

## 2021-08-30 MED ORDER — KETOROLAC TROMETHAMINE 30 MG/ML IJ SOLN
INTRAMUSCULAR | Status: AC
  Filled 2021-08-30: qty 1

## 2021-08-30 MED ORDER — FERROUS SULFATE 325 (65 FE) MG PO TABS
325.0000 mg | ORAL_TABLET | Freq: Three times a day (TID) | ORAL | Status: DC
Start: 2021-08-30 — End: 2021-08-31
  Filled 2021-08-30: qty 1

## 2021-08-30 MED ORDER — ASPIRIN 81 MG PO CHEW
81.0000 mg | CHEWABLE_TABLET | Freq: Two times a day (BID) | ORAL | Status: DC
Start: 2021-08-30 — End: 2021-08-31
  Administered 2021-08-30 – 2021-08-31 (×2): 81 mg via ORAL
  Filled 2021-08-30 (×2): qty 1

## 2021-08-30 MED ORDER — TRAZODONE HCL 50 MG PO TABS
50.0000 mg | ORAL_TABLET | Freq: Every evening | ORAL | Status: DC | PRN
  Administered 2021-08-31: 50 mg via ORAL
  Filled 2021-08-30: qty 1

## 2021-08-30 MED ORDER — SODIUM CHLORIDE 0.9 % IV SOLN: INTRAVENOUS | Status: DC

## 2021-08-30 MED ORDER — OXYCODONE HCL 5 MG PO TABS
10.0000 mg | ORAL_TABLET | ORAL | Status: DC | PRN
  Administered 2021-08-30: 10 mg via ORAL
  Filled 2021-08-30: qty 2

## 2021-08-30 MED ORDER — PROMETHAZINE HCL 25 MG/ML IJ SOLN: 6.2500 mg | INTRAMUSCULAR | Status: DC | PRN

## 2021-08-30 MED ORDER — HYDROMORPHONE HCL 1 MG/ML IJ SOLN: 0.5000 mg | INTRAMUSCULAR | Status: DC | PRN

## 2021-08-30 MED ORDER — DEXAMETHASONE SODIUM PHOSPHATE 10 MG/ML IJ SOLN
INTRAMUSCULAR | Status: AC
  Filled 2021-08-30: qty 1

## 2021-08-30 MED ORDER — VANCOMYCIN HCL 1000 MG IV SOLR
INTRAVENOUS | Status: AC
  Filled 2021-08-30: qty 20

## 2021-08-30 MED ORDER — BISACODYL 10 MG RE SUPP: 10.0000 mg | Freq: Every day | RECTAL | Status: DC | PRN

## 2021-08-30 MED ORDER — POVIDONE-IODINE 10 % EX SWAB
2.0000 | Freq: Once | CUTANEOUS | Status: AC
  Administered 2021-08-30: 2 via TOPICAL

## 2021-08-30 MED ORDER — MIDAZOLAM HCL 2 MG/2ML IJ SOLN: 2.0000 mg | Freq: Once | INTRAMUSCULAR | Status: AC

## 2021-08-30 MED ORDER — PROPOFOL 500 MG/50ML IV EMUL
INTRAVENOUS | Status: DC | PRN
  Administered 2021-08-30: 75 ug/kg/min via INTRAVENOUS

## 2021-08-30 MED ORDER — LACTATED RINGERS IV SOLN: INTRAVENOUS | Status: DC

## 2021-08-30 MED ORDER — ACETAMINOPHEN 325 MG PO TABS: 325.0000 mg | ORAL_TABLET | Freq: Four times a day (QID) | ORAL | Status: DC | PRN

## 2021-08-30 MED ORDER — MIDAZOLAM HCL 2 MG/2ML IJ SOLN
INTRAMUSCULAR | Status: AC
  Filled 2021-08-30: qty 2

## 2021-08-30 MED ORDER — ONDANSETRON HCL 4 MG/2ML IJ SOLN: 4.0000 mg | Freq: Four times a day (QID) | INTRAMUSCULAR | Status: DC | PRN

## 2021-08-30 MED ORDER — METHOCARBAMOL 500 MG PO TABS
500.0000 mg | ORAL_TABLET | Freq: Four times a day (QID) | ORAL | Status: DC | PRN
  Administered 2021-08-30: 500 mg via ORAL
  Filled 2021-08-30 (×2): qty 1

## 2021-08-30 MED ORDER — DEXAMETHASONE SODIUM PHOSPHATE 10 MG/ML IJ SOLN
8.0000 mg | Freq: Once | INTRAMUSCULAR | Status: AC
  Administered 2021-08-30: 8 mg via INTRAVENOUS

## 2021-08-30 MED ORDER — OXYCODONE HCL 5 MG PO TABS: 5.0000 mg | ORAL_TABLET | Freq: Once | ORAL | Status: DC | PRN

## 2021-08-30 MED ORDER — PROPOFOL 500 MG/50ML IV EMUL
INTRAVENOUS | Status: AC
Start: 2021-08-30 — End: ?
  Filled 2021-08-30: qty 50

## 2021-08-30 MED ORDER — IRBESARTAN 150 MG PO TABS
150.0000 mg | ORAL_TABLET | Freq: Every day | ORAL | Status: DC
  Filled 2021-08-30: qty 1

## 2021-08-30 MED ORDER — HYDROCHLOROTHIAZIDE 25 MG PO TABS
25.0000 mg | ORAL_TABLET | Freq: Every day | ORAL | Status: DC
  Filled 2021-08-30: qty 1

## 2021-08-30 MED ORDER — FENTANYL CITRATE PF 50 MCG/ML IJ SOSY: 100.0000 ug | PREFILLED_SYRINGE | Freq: Once | INTRAMUSCULAR | Status: AC

## 2021-08-30 MED ORDER — POLYETHYLENE GLYCOL 3350 17 G PO PACK: 17.0000 g | PACK | Freq: Every day | ORAL | Status: DC | PRN

## 2021-08-30 MED ORDER — FENTANYL CITRATE PF 50 MCG/ML IJ SOSY
PREFILLED_SYRINGE | INTRAMUSCULAR | Status: AC
  Administered 2021-08-30: 100 ug via INTRAVENOUS
  Filled 2021-08-30: qty 2

## 2021-08-30 MED ORDER — OXYCODONE HCL 5 MG/5ML PO SOLN: 5.0000 mg | Freq: Once | ORAL | Status: DC | PRN

## 2021-08-30 MED ORDER — STERILE WATER FOR IRRIGATION IR SOLN
Status: DC | PRN
  Administered 2021-08-30: 2000 mL

## 2021-08-30 MED ORDER — DEXAMETHASONE SODIUM PHOSPHATE 10 MG/ML IJ SOLN
10.0000 mg | Freq: Once | INTRAMUSCULAR | Status: AC
  Administered 2021-08-31: 10 mg via INTRAVENOUS
  Filled 2021-08-30: qty 1

## 2021-08-30 MED ORDER — 0.9 % SODIUM CHLORIDE (POUR BTL) OPTIME
TOPICAL | Status: DC | PRN
  Administered 2021-08-30: 1000 mL

## 2021-08-30 MED ORDER — PROPOFOL 10 MG/ML IV BOLUS
INTRAVENOUS | Status: AC
  Filled 2021-08-30: qty 20

## 2021-08-30 MED ORDER — PHENYLEPHRINE HCL (PRESSORS) 10 MG/ML IV SOLN
INTRAVENOUS | Status: AC
  Filled 2021-08-30: qty 1

## 2021-08-30 MED ORDER — CEFAZOLIN SODIUM-DEXTROSE 2-4 GM/100ML-% IV SOLN
2.0000 g | Freq: Four times a day (QID) | INTRAVENOUS | Status: AC
  Administered 2021-08-30 – 2021-08-31 (×2): 2 g via INTRAVENOUS
  Filled 2021-08-30 (×2): qty 100

## 2021-08-30 MED ORDER — ONDANSETRON HCL 4 MG/2ML IJ SOLN
INTRAMUSCULAR | Status: AC
Start: 2021-08-30 — End: ?
  Filled 2021-08-30: qty 2

## 2021-08-30 MED ORDER — ONDANSETRON HCL 4 MG/2ML IJ SOLN
INTRAMUSCULAR | Status: DC | PRN
  Administered 2021-08-30: 4 mg via INTRAVENOUS

## 2021-08-30 MED ORDER — VALSARTAN-HYDROCHLOROTHIAZIDE 160-25 MG PO TABS: 1.0000 | ORAL_TABLET | Freq: Every day | ORAL | Status: DC

## 2021-08-30 MED ORDER — SODIUM CHLORIDE (PF) 0.9 % IJ SOLN
INTRAMUSCULAR | Status: DC | PRN
  Administered 2021-08-30: 30 mL

## 2021-08-30 MED ORDER — METOCLOPRAMIDE HCL 5 MG PO TABS: 5.0000 mg | ORAL_TABLET | Freq: Three times a day (TID) | ORAL | Status: DC | PRN

## 2021-08-30 MED ORDER — TRANEXAMIC ACID-NACL 1000-0.7 MG/100ML-% IV SOLN
1000.0000 mg | INTRAVENOUS | Status: AC
Start: 2021-08-30 — End: 2021-08-30
  Administered 2021-08-30: 1000 mg via INTRAVENOUS

## 2021-08-30 MED ORDER — BUPIVACAINE-EPINEPHRINE 0.5% -1:200000 IJ SOLN
INTRAMUSCULAR | Status: DC | PRN
  Administered 2021-08-30: 30 mL

## 2021-08-30 MED ORDER — TRANEXAMIC ACID-NACL 1000-0.7 MG/100ML-% IV SOLN
INTRAVENOUS | Status: AC
  Administered 2021-08-30: 1000 mg via INTRAVENOUS
  Filled 2021-08-30: qty 100

## 2021-08-30 MED ORDER — ACETAMINOPHEN 500 MG PO TABS
1000.0000 mg | ORAL_TABLET | Freq: Four times a day (QID) | ORAL | Status: DC
  Administered 2021-08-30 – 2021-08-31 (×4): 1000 mg via ORAL
  Filled 2021-08-30 (×4): qty 2

## 2021-08-30 MED ORDER — KETOROLAC TROMETHAMINE 30 MG/ML IJ SOLN
INTRAMUSCULAR | Status: DC | PRN
  Administered 2021-08-30: 30 mg

## 2021-08-30 MED ORDER — TRANEXAMIC ACID-NACL 1000-0.7 MG/100ML-% IV SOLN
1000.0000 mg | Freq: Once | INTRAVENOUS | Status: AC
  Filled 2021-08-30: qty 100

## 2021-08-30 MED ORDER — METHOCARBAMOL 500 MG IVPB - SIMPLE MED: 500.0000 mg | Freq: Four times a day (QID) | INTRAVENOUS | Status: DC | PRN

## 2021-08-30 MED ORDER — DOCUSATE SODIUM 100 MG PO CAPS
100.0000 mg | ORAL_CAPSULE | Freq: Two times a day (BID) | ORAL | Status: DC
  Administered 2021-08-30 – 2021-08-31 (×2): 100 mg via ORAL
  Filled 2021-08-30 (×2): qty 1

## 2021-08-30 MED ORDER — VANCOMYCIN HCL 1 G IV SOLR
INTRAVENOUS | Status: DC | PRN
  Administered 2021-08-30: 1000 mg

## 2021-08-30 MED ORDER — MENTHOL 3 MG MT LOZG: 1.0000 | LOZENGE | OROMUCOSAL | Status: DC | PRN

## 2021-08-30 MED ORDER — PHENYLEPHRINE 80 MCG/ML (10ML) SYRINGE FOR IV PUSH (FOR BLOOD PRESSURE SUPPORT)
PREFILLED_SYRINGE | INTRAVENOUS | Status: DC | PRN
  Administered 2021-08-30: 160 ug via INTRAVENOUS

## 2021-08-30 MED ORDER — OXYCODONE HCL 5 MG PO TABS
5.0000 mg | ORAL_TABLET | ORAL | Status: DC | PRN
  Administered 2021-08-31: 5 mg via ORAL
  Administered 2021-08-31 (×2): 10 mg via ORAL
  Administered 2021-08-31: 5 mg via ORAL
  Filled 2021-08-30 (×2): qty 2
  Filled 2021-08-30 (×2): qty 1

## 2021-08-30 MED ORDER — PHENYLEPHRINE HCL-NACL 20-0.9 MG/250ML-% IV SOLN
INTRAVENOUS | Status: DC | PRN
  Administered 2021-08-30: 35 ug/min via INTRAVENOUS

## 2021-08-30 MED ORDER — DIPHENHYDRAMINE HCL 12.5 MG/5ML PO ELIX: 12.5000 mg | ORAL_SOLUTION | ORAL | Status: DC | PRN

## 2021-08-30 MED ORDER — ORAL CARE MOUTH RINSE: 15.0000 mL | Freq: Once | OROMUCOSAL | Status: AC

## 2021-08-30 MED ORDER — SODIUM CHLORIDE (PF) 0.9 % IJ SOLN
INTRAMUSCULAR | Status: AC
  Filled 2021-08-30: qty 30

## 2021-08-30 MED ORDER — PHENYLEPHRINE 80 MCG/ML (10ML) SYRINGE FOR IV PUSH (FOR BLOOD PRESSURE SUPPORT)
PREFILLED_SYRINGE | INTRAVENOUS | Status: AC
  Filled 2021-08-30: qty 10

## 2021-08-30 MED ORDER — PHENOL 1.4 % MT LIQD: 1.0000 | OROMUCOSAL | Status: DC | PRN

## 2021-08-30 MED ORDER — CHLORHEXIDINE GLUCONATE 0.12 % MT SOLN
15.0000 mL | Freq: Once | OROMUCOSAL | Status: AC
  Administered 2021-08-30: 15 mL via OROMUCOSAL

## 2021-08-30 MED ORDER — SODIUM CHLORIDE 0.9 % IR SOLN
Status: DC | PRN
  Administered 2021-08-30: 1000 mL

## 2021-08-30 MED ORDER — PROPOFOL 10 MG/ML IV BOLUS
INTRAVENOUS | Status: DC | PRN
  Administered 2021-08-30: 40 mg via INTRAVENOUS

## 2021-08-30 MED ORDER — MIDAZOLAM HCL 2 MG/2ML IJ SOLN
INTRAMUSCULAR | Status: AC
  Administered 2021-08-30: 2 mg via INTRAVENOUS
  Filled 2021-08-30: qty 2

## 2021-08-30 MED ORDER — METOCLOPRAMIDE HCL 5 MG/ML IJ SOLN: 5.0000 mg | Freq: Three times a day (TID) | INTRAMUSCULAR | Status: DC | PRN

## 2021-08-30 MED ORDER — ROPIVACAINE HCL 5 MG/ML IJ SOLN
INTRAMUSCULAR | Status: DC | PRN
  Administered 2021-08-30: 20 mL via PERINEURAL

## 2021-08-30 SURGICAL SUPPLY — 71 items
ATTUNE MED ANAT PAT 38 KNEE (Knees) IMPLANT
ATTUNE PSRP INSR SZ7 10 KNEE (Insert) IMPLANT
ATUNE CEMENTED STEM 16X80 (Stem) ×1 IMPLANT
AUG DIST FEM KNEE SZ7 8 (Miscellaneous) ×2 IMPLANT
AUGMENT DIST FEM KNEE SZ7 8 (Miscellaneous) IMPLANT
BAG COUNTER SPONGE SURGICOUNT (BAG) IMPLANT
BAG ZIPLOCK 12X15 (MISCELLANEOUS) ×1 IMPLANT
BANDAGE ACE 6X5 VEL STRL LF (GAUZE/BANDAGES/DRESSINGS) IMPLANT
BASE TIBIAL ATTUNE SZ7 RP REV (Joint) IMPLANT
BLADE SAW SGTL 11.0X1.19X90.0M (BLADE) ×1 IMPLANT
BLADE SAW SGTL 13.0X1.19X90.0M (BLADE) ×1 IMPLANT
BLADE SAW SGTL 81X20 HD (BLADE) ×1 IMPLANT
BNDG ELASTIC 6X5.8 VLCR STR LF (GAUZE/BANDAGES/DRESSINGS) ×1 IMPLANT
BOWL SMART MIX CTS (DISPOSABLE) ×1 IMPLANT
BRUSH FEMORAL CANAL (MISCELLANEOUS) IMPLANT
CEMENT HV SMART SET (Cement) ×2 IMPLANT
CEMENT RESTRICTOR DEPUY SZ 7 (Cement) IMPLANT
COMP FEM ATTUNE CRS SZ7 RT (Femur) ×1 IMPLANT
COMPONENT FEM ATN CRS SZ7 RT (Femur) IMPLANT
COVER SURGICAL LIGHT HANDLE (MISCELLANEOUS) ×1 IMPLANT
CUFF TOURN SGL QUICK 34 (TOURNIQUET CUFF) ×1
CUFF TRNQT CYL 34X4.125X (TOURNIQUET CUFF) ×1 IMPLANT
DERMABOND ADVANCED (GAUZE/BANDAGES/DRESSINGS) ×1
DERMABOND ADVANCED .7 DNX12 (GAUZE/BANDAGES/DRESSINGS) ×1 IMPLANT
DRAPE INCISE IOBAN 66X45 STRL (DRAPES) ×3 IMPLANT
DRAPE U-SHAPE 47X51 STRL (DRAPES) ×2 IMPLANT
DRESSING AQUACEL AG SP 3.5X10 (GAUZE/BANDAGES/DRESSINGS) IMPLANT
DRSG AQUACEL AG ADV 3.5X14 (GAUZE/BANDAGES/DRESSINGS) IMPLANT
DRSG AQUACEL AG SP 3.5X10 (GAUZE/BANDAGES/DRESSINGS)
DRSG PAD ABDOMINAL 8X10 ST (GAUZE/BANDAGES/DRESSINGS) IMPLANT
DURAPREP 26ML APPLICATOR (WOUND CARE) ×2 IMPLANT
ELECT REM PT RETURN 15FT ADLT (MISCELLANEOUS) ×1 IMPLANT
GLOVE BIO SURGEON STRL SZ 6 (GLOVE) ×1 IMPLANT
GLOVE BIOGEL PI IND STRL 7.5 (GLOVE) ×3 IMPLANT
GLOVE BIOGEL PI IND STRL 8.5 (GLOVE) ×1 IMPLANT
GLOVE BIOGEL PI INDICATOR 7.5 (GLOVE) ×3
GLOVE BIOGEL PI INDICATOR 8.5 (GLOVE) ×1
GLOVE ECLIPSE 8.0 STRL XLNG CF (GLOVE) ×2 IMPLANT
GLOVE INDICATOR 6.5 STRL GRN (GLOVE) ×2 IMPLANT
GOWN STRL REUS W/ TWL LRG LVL3 (GOWN DISPOSABLE) ×3 IMPLANT
GOWN STRL REUS W/TWL LRG LVL3 (GOWN DISPOSABLE) ×3
HOLDER FOLEY CATH W/STRAP (MISCELLANEOUS) ×1 IMPLANT
KIT TURNOVER KIT A (KITS) IMPLANT
MANIFOLD NEPTUNE II (INSTRUMENTS) ×1 IMPLANT
NDL SAFETY ECLIPSE 18X1.5 (NEEDLE) ×1 IMPLANT
NEEDLE HYPO 18GX1.5 SHARP (NEEDLE) ×1
NS IRRIG 1000ML POUR BTL (IV SOLUTION) ×1 IMPLANT
PACK TOTAL KNEE CUSTOM (KITS) ×1 IMPLANT
PADDING CAST COTTON 6X4 STRL (CAST SUPPLIES) IMPLANT
PROTECTOR NERVE ULNAR (MISCELLANEOUS) ×1 IMPLANT
REV DIS FEM AUG SZ7 CNTD 8 (Miscellaneous) ×2 IMPLANT
SET PAD KNEE POSITIONER (MISCELLANEOUS) ×1 IMPLANT
SLEEVE TIB ATTUNE FP 61 (Knees) IMPLANT
SOLUTION PRONTOSAN WOUND 350ML (IRRIGATION / IRRIGATOR) IMPLANT
STAPLER VISISTAT 35W (STAPLE) IMPLANT
STEM ATTUNE CEMENTED 16X80 (Stem) IMPLANT
STEM STR ATTUNE PF 22X60 (Knees) IMPLANT
SUT MNCRL AB 3-0 PS2 18 (SUTURE) ×1 IMPLANT
SUT STRATAFIX PDS 23 CT-1 (SUTURE) IMPLANT
SUT STRATAFIX PDS+ 0 24IN (SUTURE) ×1 IMPLANT
SUT VIC AB 1 CT1 36 (SUTURE) ×3 IMPLANT
SUT VIC AB 2-0 CT1 27 (SUTURE) ×3
SUT VIC AB 2-0 CT1 TAPERPNT 27 (SUTURE) ×3 IMPLANT
SWAB COLLECTION DEVICE MRSA (MISCELLANEOUS) IMPLANT
SWAB CULTURE ESWAB REG 1ML (MISCELLANEOUS) IMPLANT
SYR 3ML LL SCALE MARK (SYRINGE) ×1 IMPLANT
TIBIAL BASE ATTUNE SZ7 RP REV (Joint) ×1 IMPLANT
TRAY FOLEY MTR SLVR 16FR STAT (SET/KITS/TRAYS/PACK) ×1 IMPLANT
TUBE SUCTION HIGH CAP CLEAR NV (SUCTIONS) ×1 IMPLANT
WATER STERILE IRR 1000ML POUR (IV SOLUTION) ×2 IMPLANT
WRAP KNEE MAXI GEL POST OP (GAUZE/BANDAGES/DRESSINGS) ×1 IMPLANT

## 2021-08-30 NOTE — H&P (Signed)
TOTAL KNEE REIMPLANTATION ADMISSION H&P  Patient is being admitted for right total knee arthroplasty reimplantation vs repeat antibiotic spacer placement.   Subjective:  Chief Complaint: Status post right total knee arthroplasty resection due to prosthetic joint infection.   HPI: Shawn Harris, 71 y.o. male, has a history of primary right total knee arthroplasty in February 2022 by Dr. Marry Guan. He started having pain and swelling about 3 months post op. Synovasure analysis/culture of aspirate revealed prosthetic joint infection. 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 He underwent resection with placement of antibiotic spacer on 05/20/21. PICC line was placed, and he received 6 weeks of IV therapy on Daptomycin. ESR and CRP on 07/13/21 were normalized per ID.   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   Hypertension 03/23/2018   Hereditary hemochromatosis ([place]) 03/09/2017   Past Medical History:  Diagnosis Date   Arthritis    Hemochromatosis    Hypertension     Past Surgical History:  Procedure Laterality Date   CARPAL TUNNEL RELEASE  1970 s   bilateral   COLONOSCOPY  2010, 2020   EXCISIONAL TOTAL KNEE ARTHROPLASTY WITH ANTIBIOTIC SPACERS Right 05/20/2021   Procedure: EXCISIONAL TOTAL KNEE ARTHROPLASTY WITH ANTIBIOTIC SPACERS;  Surgeon: Paralee Cancel, MD;  Location: [facility];  Service: Orthopedics;  Laterality: Right;   KNEE ARTHROPLASTY Right 02/21/2020   Procedure: COMPUTER ASSISTED TOTAL KNEE ARTHROPLASTY;  Surgeon: Dereck Leep, MD;  Location: [facility] ORS;  Service: Orthopedics;  Laterality: Right;   KNEE SURGERY Bilateral 1980's x3 surg   bilateral knee surg-torn meniscus    No current facility-administered medications for this encounter.   Current Outpatient Medications  Medication Sig Dispense Refill Last Dose   aspirin EC 81 MG tablet Take 81 mg by mouth in the morning.  Swallow whole.      Probiotic Product (PROBIOTIC GUMMIES PO) Take 1 tablet by mouth in the morning.      traZODone (DESYREL) 50 MG tablet Take 50 mg by mouth at bedtime as needed for sleep.      valsartan-hydrochlorothiazide (DIOVAN-HCT) 160-25 MG tablet Take 1 tablet by mouth daily.      acetaminophen (TYLENOL) 500 MG tablet Take 2 tablets (1,000 mg total) by mouth every 8 (eight) hours. (Patient not taking: Reported on 08/10/2021) 90 tablet 1 Not Taking   docusate sodium (COLACE) 100 MG capsule Take 1 capsule (100 mg total) by mouth 2 (two) times daily. (Patient not taking: Reported on 08/10/2021) 10 capsule 0 Not Taking   ferrous sulfate 325 (65 FE) MG tablet Take 1 tablet (325 mg total) by mouth 2 (two) times daily with a meal. (Patient not taking: Reported on 08/10/2021) 40 tablet 1 Not Taking   methocarbamol (ROBAXIN) 500 MG tablet Take 1 tablet (500 mg total) by mouth every 6 (six) hours as needed for muscle spasms. (Patient not taking: Reported on 08/10/2021) 60 tablet 2 Not Taking   oxyCODONE (OXY IR/ROXICODONE) 5 MG immediate release tablet Take 1-2 tablets (5-10 mg total) by mouth every 4 (four) hours as needed for severe pain or moderate pain (pain). (Patient not taking: Reported on 08/10/2021) 60 tablet 0 Not Taking   polyethylene glycol (MIRALAX / GLYCOLAX) 17 g packet Take 17 g by mouth 2 (two) times daily as needed. (Patient not taking: Reported on 08/10/2021) 14 each 0 Not Taking   No Known Allergies  Social History   Tobacco Use   Smoking status: Never  Smokeless tobacco: Former  Substance Use Topics   Alcohol use: Never    Family History  Problem Relation Age of Onset   Cirrhosis Mother    Hemachromatosis Mother    Coronary artery disease Father    Dementia Father    Prostate cancer Father    Arthritis Sister    Pancreatic disease Brother       Review of Systems  Constitutional:  Negative for chills and fever.  Respiratory:  Negative for cough and shortness of breath.    Cardiovascular:  Negative for chest pain.  Gastrointestinal:  Negative for nausea and vomiting.  Musculoskeletal:  Positive for arthralgias.      Objective:  Physical Exam Well nourished and well developed. General: Alert and oriented x3, cooperative and pleasant, no acute distress. Head: normocephalic, atraumatic, neck supple. Eyes: EOMI.  Musculoskeletal: Right knee exam: His incision is healed without drainage No significant erythema Generalized fullness to the knee but no significant palpable effusion Mild tenderness to palpation Range of motion reveals 5 to 10 degree flexion contracture with flexion fairly easily over 90 degrees Mild lower committee edema without erythema  Calves soft and nontender. Motor function intact in LE. Strength 5/5 LE bilaterally. Neuro: Distal pulses 2+. Sensation to light touch intact in LE.  Vital signs in last 24 hours:    Labs:  Estimated body mass index is 32.28 kg/m as calculated from the following:   Height as of 08/18/21: 6' (1.829 m).   Weight as of 08/18/21: 108 kg.  Imaging Review No new imaging performed since resection arthroplasty.    Assessment/Plan:  Status post right knee resection arthroplasty with antibiotic spacer placement.  The patient history, physical examination, clinical judgment of the provider and imaging studies are consistent with prior resection of the right total knee. Reimplantation of total knee arthroplasty is deemed medically necessary. The treatment options including medical management, injection therapy, arthroscopy and revision arthroplasty were discussed at length. The risks and benefits of reimplantation  total knee arthroplasty were presented and reviewed. The risks due to aseptic loosening, infection, stiffness, patella tracking problems, thromboembolic complications and other imponderables were discussed. The patient acknowledged the explanation, agreed to proceed with the plan and consent was  signed. Patient is being admitted for inpatient treatment for surgery, pain control, PT, OT, prophylactic antibiotics, VTE prophylaxis, progressive ambulation and ADL's and discharge planning.The patient is planning to be discharged  home.  Therapy Plans: outpatient therapy at [facility] in [place] Disposition: Home with wife Planned DVT Prophylaxis: aspirin 81mg  BID DME needed: none PCP: Dr. Kary Kos, clearance received TXA: IV Allergies: NKDA Anesthesia Concerns: none BMI: 33.2 Last HgbA1c: Not diabetic   Other: - Oxycodone (30), robaxin, tylenol    Costella Hatcher, PA-C Orthopedic Surgery [facility] [place] [phone]

## 2021-08-30 NOTE — Brief Op Note (Signed)
08/30/2021  3:43 PM  PATIENT:  Milon Score  71 y.o. male  PRE-OPERATIVE DIAGNOSIS:  Status post right total knee revision and placement anbtibiotic spacer  POST-OPERATIVE DIAGNOSIS:  Status post right total knee revision and placement anbtibiotic spacer  PROCEDURE:  Procedure(s): Right total knee reimplantation (Right)  SURGEON:  Surgeon(s) and Role:    Durene Romans, MD - Primary  PHYSICIAN ASSISTANT: Rosalene Billings, PA-C  ANESTHESIA:   regional and spinal  EBL:  250 mL   BLOOD ADMINISTERED:none  DRAINS: none   LOCAL MEDICATIONS USED:  MARCAINE     SPECIMEN:  No Specimen  DISPOSITION OF SPECIMEN:  N/A  COUNTS:  YES  TOURNIQUET:   Total Tourniquet Time Documented: Thigh (Right) - 68 minutes Total: Thigh (Right) - 68 minutes   DICTATION: .Other Dictation: Dictation Number [id number]  PLAN OF CARE: Admit to inpatient   PATIENT DISPOSITION:  PACU - hemodynamically stable.   Delay start of Pharmacological VTE agent (>24hrs) due to surgical blood loss or risk of bleeding: no

## 2021-08-30 NOTE — Anesthesia Procedure Notes (Signed)
Anesthesia Regional Block: Adductor canal block   Pre-Anesthetic Checklist: , timeout performed,  Correct Patient, Correct Site, Correct Laterality,  Correct Procedure, Correct Position, site marked,  Risks and benefits discussed,  Surgical consent,  Pre-op evaluation,  At surgeon's request and post-op pain management  Laterality: Right  Prep: chloraprep       Needles:  Injection technique: Single-shot  Needle Type: Stimiplex     Needle Length: 9cm  Needle Gauge: 21     Additional Needles:   Procedures:,,,, ultrasound used (permanent image in chart),,    Narrative:  Start time: 08/30/2021 10:55 AM End time: 08/30/2021 11:00 AM Injection made incrementally with aspirations every 5 mL.  Performed by: Personally  Anesthesiologist: Lowella Curb, MD

## 2021-08-30 NOTE — Transfer of Care (Signed)
Immediate Anesthesia Transfer of Care Note  Patient: Shawn Harris  Procedure(s) Performed: Right total knee reimplantation (Right: Knee)  Patient Location: PACU  Anesthesia Type:Spinal  Level of Consciousness: sedated  Airway & Oxygen Therapy: Patient Spontanous Breathing and Patient connected to face mask oxygen  Post-op Assessment: Report given to RN and Post -op Vital signs reviewed and stable  Post vital signs: Reviewed and stable  Last Vitals:  Vitals Value Taken Time  BP    Temp    Pulse 81 08/30/21 1554  Resp 15 08/30/21 1554  SpO2 97 % 08/30/21 1554  Vitals shown include unvalidated device data.  Last Pain:  Vitals:   08/30/21 1030  TempSrc:   PainSc: 0-No pain         Complications: No notable events documented.

## 2021-08-30 NOTE — Discharge Instructions (Signed)

## 2021-08-30 NOTE — Anesthesia Preprocedure Evaluation (Signed)
Anesthesia Evaluation  Patient identified by MRN, date of birth, ID band Patient awake    Reviewed: Allergy & Precautions, NPO status , Patient's Chart, lab work & pertinent test results  Airway Mallampati: II  TM Distance: >3 FB Neck ROM: Full    Dental  (+) Missing   Pulmonary neg pulmonary ROS,    Pulmonary exam normal breath sounds clear to auscultation       Cardiovascular hypertension, Pt. on medications Normal cardiovascular exam Rhythm:Regular Rate:Normal     Neuro/Psych negative neurological ROS  negative psych ROS   GI/Hepatic negative GI ROS, Neg liver ROS,   Endo/Other  negative endocrine ROS  Renal/GU negative Renal ROS  negative genitourinary   Musculoskeletal  (+) Arthritis , Osteoarthritis,    Abdominal   Peds negative pediatric ROS (+)  Hematology negative hematology ROS (+)   Anesthesia Other Findings   Reproductive/Obstetrics negative OB ROS                             Anesthesia Physical  Anesthesia Plan  ASA: 2  Anesthesia Plan: Spinal and Regional   Post-op Pain Management: Regional block* and Minimal or no pain anticipated   Induction: Intravenous  PONV Risk Score and Plan: 1 and Propofol infusion and Treatment may vary due to age or medical condition  Airway Management Planned: Simple Face Mask  Additional Equipment:   Intra-op Plan:   Post-operative Plan:   Informed Consent: I have reviewed the patients History and Physical, chart, labs and discussed the procedure including the risks, benefits and alternatives for the proposed anesthesia with the patient or authorized representative who has indicated his/her understanding and acceptance.     Dental advisory given  Plan Discussed with: CRNA and Surgeon  Anesthesia Plan Comments:         Anesthesia Quick Evaluation

## 2021-08-30 NOTE — Interval H&P Note (Signed)
History and Physical Interval Note:  08/30/2021 1:13 PM  Shawn Harris  has presented today for surgery, with the diagnosis of Status post right total knee revision and placement anbtibiotic spacer.  The various methods of treatment have been discussed with the patient and family. After consideration of risks, benefits and other options for treatment, the patient has consented to  Procedure(s): Right total knee reimplantation/revision versus repeat antibiotic spacer (Right) as a surgical intervention.  The patient's history has been reviewed, patient examined, no change in status, stable for surgery.  I have reviewed the patient's chart and labs.  Questions were answered to the patient's satisfaction.     Shelda Pal

## 2021-08-31 LAB — BASIC METABOLIC PANEL
Anion gap: 8 (ref 5–15)
BUN: 22 mg/dL (ref 8–23)
CO2: 26 mmol/L (ref 22–32)
Calcium: 8.4 mg/dL — ABNORMAL LOW (ref 8.9–10.3)
Chloride: 103 mmol/L (ref 98–111)
Creatinine, Ser: 1.05 mg/dL (ref 0.61–1.24)
GFR, Estimated: >60 mL/min (ref >60)
Glucose, Bld: 137 mg/dL — ABNORMAL HIGH (ref 70–99)
Potassium: 4 mmol/L (ref 3.5–5.1)
Sodium: 137 mmol/L (ref 135–145)

## 2021-08-31 LAB — CBC
HCT: 42.2 % (ref 39.0–52.0)
Hemoglobin: 14 g/dL (ref 13.0–17.0)
MCH: 28.4 pg (ref 26.0–34.0)
MCHC: 33.2 g/dL (ref 30.0–36.0)
MCV: 85.6 fL (ref 80.0–100.0)
Platelets: 261 10*3/uL (ref 150–400)
RBC: 4.93 MIL/uL (ref 4.22–5.81)
RDW: 16.1 % — ABNORMAL HIGH (ref 11.5–15.5)
WBC: 13.9 10*3/uL — ABNORMAL HIGH (ref 4.0–10.5)
nRBC: 0 % (ref 0.0–0.2)

## 2021-08-31 MED ORDER — METHOCARBAMOL 500 MG PO TABS: 500.0000 mg | ORAL_TABLET | Freq: Four times a day (QID) | ORAL | 0 refills | Status: AC | PRN

## 2021-08-31 MED ORDER — ASPIRIN 81 MG PO CHEW: 81.0000 mg | CHEWABLE_TABLET | Freq: Two times a day (BID) | ORAL | 0 refills | Status: AC

## 2021-08-31 MED ORDER — OXYCODONE HCL 5 MG PO TABS: 5.0000 mg | ORAL_TABLET | ORAL | 0 refills | Status: AC | PRN

## 2021-08-31 MED ORDER — CEFADROXIL 500 MG PO CAPS: 500.0000 mg | ORAL_CAPSULE | Freq: Two times a day (BID) | ORAL | 0 refills | Status: AC

## 2021-08-31 NOTE — Op Note (Signed)
NAME: Shawn Harris, Shawn Harris MEDICAL RECORD NO: [id number] ACCOUNT NO: [id number] DATE OF BIRTH: 1950/02/28 FACILITY: [facility] PHYSICIAN: Pietro Cassis. Alvan Dame, MD  Operative Report   DATE OF PROCEDURE: 08/30/2021  PREOPERATIVE DIAGNOSIS:  History of right total knee arthroplasty complicated by infection, now status post resection arthroplasty with placement of articulating antibiotic spacer.  POSTOPERATIVE DIAGNOSIS:  History of right total knee arthroplasty complicated by infection, now status post resection arthroplasty with placement of articulating antibiotic spacer.  PROCEDURE:  Right total knee reimplantation/revision surgery.  COMPONENTS USED:  The femoral side was a size 7 femur with 8 mm distal medial and lateral augments with an 80 x 16 mm cemented stem.  The tibia side was a size 7 tibial tray with a 61 press-fit tibial sleeve with a 22 x 60 mm press-fit stem.  We used a  38 patellar button. We used the 10 mm insert to match the 7 femur.  SURGEON:  Pietro Cassis. Alvan Dame, MD  ASSISTANT:  Costella Hatcher, PA-C.  Note that Ms. Lu Duffel was present for the entirety of the case from preoperative positioning, perioperative management of the operative extremity, general facilitation of the case and primary wound closure.  ANESTHESIA:  Regional plus spinal.  DRAINS:  None.  COMPLICATIONS:  None.  BLOOD LOSS:  Less than 200 mL  TOURNIQUET:  Up for 68 minutes at 225 mmHg.  INDICATIONS FOR THE PROCEDURE:  The patient is a pleasant 71 year old male with a history of right total knee arthroplasty about a year and a half or so ago.  He presented for evaluation and management of his right knee.  He was noted to have an  infection.  We took him to the operating room and performed the first of a 2-stage procedure by resecting his knee and placement of antibiotic spacer.  He has subsequently undergone 6 weeks of IV antibiotics and has been off antibiotics for approximately  6 weeks.  There were  no signs of recurrence of infection and his lab studies trended to near normal.  At this point, we planned for reimplantation versus revision of his resection and repeat I and D.  Consent was obtained, understanding that there was a  chance that we would have a similar procedure that he had last time if there was any concern for infection.  Consent was obtained for management of his right knee.  DESCRIPTION OF PROCEDURE:  The patient was brought to the operative theater.  Once adequate anesthesia, preoperative antibiotics, Ancef administered, he was positioned supine.  A right thigh tourniquet was placed.  The right lower extremity was then  prepped and draped in sterile fashion.  A timeout was performed identifying the patient, planned procedure, and extremity.  The leg was exsanguinated and tourniquet elevated to 225 mmHg.  His old incision had been marked.  His incision was utilized.   Soft tissue planes created.  I then made a median parapatellar arthrotomy, encountering clear yellow synovial fluid without signs of inflammation.  Initial time was spent at exposure.  This was carried out, recreating the medial and lateral gutters,  including the parapatellar region.  We removed all the scar in the gutters as well as suprapatellar region.  I then used towel clamps and revised the cut on the patella down to 14 mm.  I selected a 38 patellar button and the lug holes were drilled.  Now  the knee was flexed, attention was now directed to removing the articulating spacer.  The articulating antibiotic spacer was  removed using osteotomes.  We removed not only the distal femur and the proximal tibia, but also the dowels.  Once this was done,  we used a canal brush irrigator and irrigated the femoral and tibial canals with about 500 mL normal saline solution.  At this point, I attended to the tibial side first.  I used an extramedullary guide to freshen up the cut here.  I did this even knowing that I was going to  be using a press-fit sleeve and elevating, but this was more just to clean things up.  We had  already reamed up to 22 mm on this side.  I broached up to 60 mm.  We then placed a 7 tray with a 61 press-fit sleeve trial and impacted this into place.  At this point, attention was directed to the femur.  The 19mm reamer was inserted as guide for revision of the distal femur.  I then  revised the distal femoral cut, removing some bone, both on the medial and lateral aspect of the knee.  Given these findings, I felt like we were likely going to need to use augments.  Once this was done, I sized and selected a size 7 femur.  The  anterior, posterior and chamfer cuts were all revisited.  I then redid the box cut.  We did a trial reduction.  With a 7 femur in place and the aforementioned tibial tray, I found that a 10 mm insert was balanced in extension and flexion.  The patella  tracked through the tract of trochlea without application of pressure.  Given these findings, all the trial components were removed.  We placed a cement restrictor into the distal femur.  We then irrigated the knee with a total at this point of 2 liters  of normal saline solution.  In addition, I had opened Prontosan antimicrobial solution and irrigated the knee while we are making components.  On the back table, the tibial component was configured under direct supervision as was the femoral component.   I placed a cement restrictor distally in the distal femur.  I then impacted the tibial tray to the position where the broach had sat.  We mixed cement.  I mixed three batches of cement with 1 gram of vancomycin.  The final femoral component was then  cemented into place.  The knee was brought to extension with a 10 mm insert.  Extruded cement was removed.  The patella was cemented into place as well.  The tourniquet was let down after 68 minutes.  There was no significant hemostasis required other  than noting typical punctate bleeding.   The synovial capsular junction in knee was injected with 0.5% Marcaine with epinephrine, Toradol and saline.  Once the cement had fully cured, the knee was flexed.  The trial liner was removed.  The remaining  cement was removed throughout the knee.  Once I was satisfied that there was no further cement that needed to be removed, the final 10 mm insert to match the 7 femur was placed in the tibial tray and the knee reduced.  We then reirrigated the knee with  another liter of normal saline solution as well as the remainder of the Prontosan solution.  Once this was done, the knee was flexed to about 60 degrees.  The extensor mechanism was reapproximated using a combination of #1 Vicryl and #1 Stratafix suture.   The remainder of the wound was closed in layers with 2-0 Vicryl and a  running Monocryl stitch.  The knee was then cleaned, dried and dressed sterilely using surgical glue and Aquacel dressing.  The knee was wrapped in an Ace wrap.  He was brought to  the recovery room in stable condition, tolerating the procedure well.  Postoperatively, he will be weightbearing as tolerated.  We will get him up with physical therapy and work on a discharge to home tomorrow or the next day.   VAI D: 08/30/2021 3:53:01 pm T: 08/31/2021 2:10:00 am  JOB: [id number]/ [id number]

## 2021-08-31 NOTE — Plan of Care (Signed)

## 2021-08-31 NOTE — TOC Transition Note (Signed)
Transition of Care [facility]) - CM/SW Discharge Note  Patient Details  Name: Shawn Harris MRN: [id number] Date of Birth: [birth year]  Transition of Care [facility]) CM/SW Contact:  Sherie Don, LCSW Phone Number: 08/31/2021, 9:54 AM  Clinical Narrative: Patient is expected to discharge home after working with PT. CSW met with patient and wife to confirm discharge plan. Patient will go home with OPPT at [facility] in [place]. Patient has a rolling walker, shower chair, and BSC at home so there are no DME needs at this time. TOC signing off.   Final next level of care: OP Rehab Barriers to Discharge: No Barriers Identified  Patient Goals and CMS Choice Patient states their goals for this hospitalization and ongoing recovery are:: Discharge home with OPPT at [facility] in [place] Choice offered to / list presented to : NA  Discharge Plan and Services         DME Arranged: N/A DME Agency: NA  Readmission Risk Interventions     No data to display

## 2021-08-31 NOTE — Progress Notes (Signed)
Patient ID: Shawn Harris, male   DOB: 05/21/1950, 71 y.o.   MRN: [id number] Subjective: 1 Day Post-Op Procedure(s) (LRB): Right total knee reimplantation (Right)    Patient reports pain as mild to moderate.  No events.  Objective:   VITALS:   Vitals:   08/31/21 0623 08/31/21 0954  BP: 95/65 (!) 108/58  Pulse: 69 68  Resp: 17   Temp: (!) 97.5 F (36.4 C)   SpO2: 95% 97%    Neurovascular intact Incision: dressing C/D/I - right knee  LABS Recent Labs    08/31/21 0329  HGB 14.0  HCT 42.2  WBC 13.9*  PLT 261    Recent Labs    08/31/21 0329  NA 137  K 4.0  BUN 22  CREATININE 1.05  GLUCOSE 137*    No results for input(s): "LABPT", "INR" in the last 72 hours.   Assessment/Plan: 1 Day Post-Op Procedure(s) (LRB): Right total knee reimplantation (Right)   Advance diet Up with therapy Plan for discharge to home after therapy today Will place him on 2 weeks of Cefadroxil and determine whether or not to continue antibiotic therapy at outpatient follow up

## 2021-08-31 NOTE — Evaluation (Signed)
Physical Therapy Evaluation Patient Details Name: Shawn Harris MRN: [id number] DOB: September 05, 1950 Today's Date: 08/31/2021  History of Present Illness  71 yo male s/p R TK revision 08/30/21. Hx of R TKA 2022, R knee infection requiring  excision + spacer 05/2021  Clinical Impression  On eval, pt was Supv level for mobility. He walked ~135 feet with a RW. Minimal pain with activity. Reviewed/practiced exercises and gait training. Pt and wife report there are no steps that he has to climb to enter home. Encouraged pt to walk often as he is able to tolerate. All education completed.        Recommendations for follow up therapy are one component of a multi-disciplinary discharge planning process, led by the attending physician.  Recommendations may be updated based on patient status, additional functional criteria and insurance authorization.  Follow Up Recommendations Follow physician's recommendations for discharge plan and follow up therapies      Assistance Recommended at Discharge PRN  Patient can return home with the following  Help with stairs or ramp for entrance;Assistance with cooking/housework;Assist for transportation    Equipment Recommendations None recommended by PT  Recommendations for Other Services       Functional Status Assessment Patient has had a recent decline in their functional status and demonstrates the ability to make significant improvements in function in a reasonable and predictable amount of time.     Precautions / Restrictions Precautions Precautions: Knee Restrictions Weight Bearing Restrictions: No Other Position/Activity Restrictions: WBAT      Mobility  Bed Mobility Overal bed mobility: Modified Independent                  Transfers Overall transfer level: Needs assistance Equipment used: Rolling walker (2 wheels) Transfers: Sit to/from Stand Sit to Stand: Supervision           General transfer comment: Supv for safety, cues     Ambulation/Gait Ambulation/Gait assistance: Supervision Gait Distance (Feet): 135 Feet Assistive device: Rolling walker (2 wheels) Gait Pattern/deviations: Step-to pattern       General Gait Details: Supv for safety. Pt denied dizziness. Tolerated distance well.  Stairs            Wheelchair Mobility    Modified Rankin (Stroke Patients Only)       Balance Overall balance assessment: Mild deficits observed, not formally tested                                           Pertinent Vitals/Pain Pain Assessment Pain Assessment: 0-10 Pain Score: 3  Pain Location: R knee Pain Descriptors / Indicators: Discomfort, Sore Pain Intervention(s): Monitored during session, Repositioned, Ice applied    Home Living Family/patient expects to be discharged to:: Private residence Living Arrangements: Spouse/significant other Available Help at Discharge: Family Type of Home: House Home Access: Stairs to enter;Level entry Entrance Stairs-Rails: None Entrance Stairs-Number of Steps: 2 at back   Home Layout: One level Home Equipment: Agricultural consultant (2 wheels);Rollator (4 wheels);Cane - single point;BSC/3in1;Shower seat;Shower seat - built in;Grab bars - tub/shower      Prior Function Prior Level of Function : Independent/Modified Independent             Mobility Comments: using RW       Hand Dominance   Dominant Hand: Right    Extremity/Trunk Assessment   Upper Extremity Assessment Upper Extremity Assessment: Overall  WFL for tasks assessed    Lower Extremity Assessment Lower Extremity Assessment: Generalized weakness    Cervical / Trunk Assessment Cervical / Trunk Assessment: Normal  Communication   Communication: No difficulties  Cognition Arousal/Alertness: Awake/alert Behavior During Therapy: WFL for tasks assessed/performed Overall Cognitive Status: Within Functional Limits for tasks assessed                                           General Comments      Exercises Total Joint Exercises Ankle Circles/Pumps: AROM, Both, 10 reps Quad Sets: AROM, Both, 10 reps Hip ABduction/ADduction: AROM, Right, 10 reps Straight Leg Raises: AROM, Right, 10 reps Goniometric ROM: ~5-95 degrees   Assessment/Plan    PT Assessment Patient needs continued PT services  PT Problem List Decreased strength;Decreased mobility;Decreased range of motion;Decreased activity tolerance;Decreased balance;Decreased knowledge of use of DME;Pain       PT Treatment Interventions DME instruction;Gait training;Therapeutic activities;Therapeutic exercise;Patient/family education;Balance training;Stair training;Functional mobility training    PT Goals (Current goals can be found in the Care Plan section)  Acute Rehab PT Goals Patient Stated Goal: home asap PT Goal Formulation: With patient/family Time For Goal Achievement: 09/14/21 Potential to Achieve Goals: Good    Frequency 7X/week     Co-evaluation               AM-PAC PT "6 Clicks" Mobility  Outcome Measure Help needed turning from your back to your side while in a flat bed without using bedrails?: None Help needed moving from lying on your back to sitting on the side of a flat bed without using bedrails?: None Help needed moving to and from a bed to a chair (including a wheelchair)?: A Little Help needed standing up from a chair using your arms (e.g., wheelchair or bedside chair)?: A Little Help needed to walk in hospital room?: A Little Help needed climbing 3-5 steps with a railing? : A Little 6 Click Score: 20    End of Session Equipment Utilized During Treatment: Gait belt Activity Tolerance: Patient tolerated treatment well Patient left: in chair;with call bell/phone within reach;with family/visitor present   PT Visit Diagnosis: Difficulty in walking, not elsewhere classified (R26.2);Pain Pain - Right/Left: Right Pain - part of body: Knee    Time:  1610-9604 PT Time Calculation (min) (ACUTE ONLY): 35 min   Charges:   PT Evaluation $PT Eval Low Complexity: 1 Low PT Treatments $Gait Training: 8-22 mins           Faye Ramsay, PT Acute Rehabilitation  Office: [phone] Pager: [phone]

## 2021-09-01 MED ORDER — BUPIVACAINE IN DEXTROSE 0.75-8.25 % IT SOLN
INTRATHECAL | Status: DC | PRN
  Administered 2021-08-30: 2 mL via INTRATHECAL

## 2021-09-01 NOTE — Anesthesia Postprocedure Evaluation (Signed)
Anesthesia Post Note  Patient: Shawn Harris  Procedure(s) Performed: Right total knee reimplantation (Right: Knee)     Patient location during evaluation: PACU Anesthesia Type: Regional and General Level of consciousness: awake and alert Pain management: pain level controlled Vital Signs Assessment: post-procedure vital signs reviewed and stable Respiratory status: spontaneous breathing, nonlabored ventilation, respiratory function stable and patient connected to nasal cannula oxygen Cardiovascular status: blood pressure returned to baseline and stable Postop Assessment: no apparent nausea or vomiting Anesthetic complications: no   No notable events documented.  Last Vitals:  Vitals:   08/31/21 0623 08/31/21 0954  BP: 95/65 (!) 108/58  Pulse: 69 68  Resp: 17   Temp: (!) 36.4 C   SpO2: 95% 97%    Last Pain:  Vitals:   08/31/21 0950  TempSrc:   PainSc: 5    Pain Goal: Patients Stated Pain Goal: 2 (08/31/21 0950)                 Maria Coin

## 2021-09-01 NOTE — Anesthesia Procedure Notes (Signed)
Spinal  Patient location during procedure: OR Start time: 08/30/2021 1:25 PM End time: 08/30/2021 1:30 PM Reason for block: surgical anesthesia Staffing Anesthesiologist: Bethena Midget, MD Performed by: Bethena Midget, MD Authorized by: Bethena Midget, MD   Preanesthetic Checklist Completed: patient identified, IV checked, site marked, risks and benefits discussed, surgical consent, monitors and equipment checked, pre-op evaluation and timeout performed Spinal Block Patient position: sitting Prep: DuraPrep Patient monitoring: heart rate, cardiac monitor, continuous pulse ox and blood pressure Approach: midline Location: L3-4 Injection technique: single-shot Needle Needle type: Sprotte  Needle gauge: 24 G Needle length: 9 cm Assessment Sensory level: T4 Events: CSF return

## 2021-09-03 ENCOUNTER — Encounter (HOSPITAL_COMMUNITY): Payer: Self-pay | Admitting: Orthopedic Surgery

## 2021-09-09 NOTE — Discharge Summary (Signed)
Patient ID: Shawn Harris MRN: [id number] DOB/AGE: 25-Oct-1950 71 y.o.  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 male who was admitted 08/30/2021 for operative treatment ofS/P total knee arthroplasty, right. Patient has severe unremitting pain that affects sleep, daily activities, and work/hobbies. After pre-op clearance the patient was taken to the operating room on 08/30/2021 and underwent  Procedure(s): Right total knee reimplantation.    Patient was given perioperative antibiotics:  Anti-infectives (From admission, onward)    Start     Dose/Rate Route Frequency Ordered Stop   08/31/21 0000  cefadroxil (DURICEF) 500 MG capsule        500 mg Oral 2 times daily 08/31/21 1317 09/14/21 2359   08/30/21 2000  ceFAZolin (ANCEF) IVPB 2g/100 mL premix        2 g 200 mL/hr over 30 Minutes Intravenous Every 6 hours 08/30/21 1713 08/31/21 0154   08/30/21 1448  vancomycin (VANCOCIN) powder  Status:  Discontinued          As needed 08/30/21 1448 08/30/21 1709   08/30/21 1030  ceFAZolin (ANCEF) IVPB 2g/100 mL premix        2 g 200 mL/hr over 30 Minutes Intravenous On call to O.R. 08/30/21 1018 08/30/21 1330        Patient was given sequential compression devices, early ambulation, and chemoprophylaxis to prevent DVT. Patient worked with PT and was meeting their goals regarding safe ambulation and transfers.  Patient benefited maximally from hospital stay and there were no complications.    Recent vital signs: No data found.    Recent laboratory studies: No results for input(s): "WBC", "HGB", "HCT", "PLT", "NA", "K", "CL", "CO2", "BUN", "CREATININE", "GLUCOSE", "INR", "CALCIUM" in the last 72 hours.  Invalid input(s): "PT", "2"   Discharge Medications:   Allergies as of 08/31/2021   No Known Allergies      Medication List     STOP taking these medications    aspirin EC 81 MG tablet Replaced by: aspirin 81 MG chewable tablet       TAKE these medications    acetaminophen 500 MG tablet Commonly known as: TYLENOL Take 2 tablets (1,000 mg total) by mouth every 8 (eight) hours.   aspirin 81 MG chewable tablet Chew 1 tablet (81 mg total) by mouth 2 (two) times daily for 28 days. Replaces: aspirin EC 81 MG tablet   cefadroxil 500 MG capsule Commonly known as: DURICEF Take 1 capsule (500 mg total) by mouth 2 (two) times daily for 14 days.   docusate sodium 100 MG capsule Commonly known as: COLACE Take 1 capsule (100 mg total) by mouth 2 (two) times daily.   ferrous sulfate 325 (65 FE) MG tablet Take 1 tablet (325 mg total) by mouth 2 (two) times daily with a meal.   methocarbamol 500 MG tablet Commonly known as: ROBAXIN Take 1 tablet (500 mg total) by mouth every 6 (six) hours as needed for muscle spasms.   oxyCODONE 5 MG immediate release tablet Commonly known as: Oxy IR/ROXICODONE Take 1 tablet (5 mg  total) by mouth every 4 (four) hours as needed for severe pain. What changed:  how much to take reasons to take this   polyethylene glycol 17 g packet Commonly known as: MIRALAX / GLYCOLAX Take 17 g by mouth 2 (two) times daily as needed.   PROBIOTIC GUMMIES PO Take 1 tablet by mouth in the morning.   traZODone 50 MG tablet Commonly known as: DESYREL Take 50 mg by mouth at bedtime as needed for sleep.   valsartan-hydrochlorothiazide 160-25 MG tablet Commonly known as: DIOVAN-HCT Take 1 tablet by mouth daily.               Discharge Care Instructions  (From admission,  onward)           Start     Ordered   08/31/21 0000  Change dressing       Comments: Maintain surgical dressing until follow up in the clinic. If the edges start to pull up, may reinforce with tape. If the dressing is no longer working, may remove and cover with gauze and tape, but must keep the area dry and clean.  Call with any questions or concerns.   08/31/21 1317            Diagnostic Studies: No results found.  Disposition: Discharge disposition: 01-Home or Self Care       Discharge Instructions     Call MD / Call 911   Complete by: As directed    If you experience chest pain or shortness of breath, CALL 911 and be transported to the hospital emergency room.  If you develope a fever above 101 F, pus (white drainage) or increased drainage or redness at the wound, or calf pain, call your surgeon's office.   Change dressing   Complete by: As directed    Maintain surgical dressing until follow up in the clinic. If the edges start to pull up, may reinforce with tape. If the dressing is no longer working, may remove and cover with gauze and tape, but must keep the area dry and clean.  Call with any questions or concerns.   Constipation Prevention   Complete by: As directed    Drink plenty of fluids.  Prune juice may be helpful.  You may use a stool softener, such as Colace (over the counter) 100 mg twice a day.  Use MiraLax (over the counter) for constipation as needed.   Diet - low sodium heart healthy   Complete by: As directed    Increase activity slowly as tolerated   Complete by: As directed    Weight bearing as tolerated with assist device (walker, cane, etc) as directed, use it as long as suggested by your surgeon or therapist, typically at least 4-6 weeks.   Post-operative opioid taper instructions:   Complete by: As directed    POST-OPERATIVE OPIOID TAPER INSTRUCTIONS: It is important to wean off of your opioid medication as soon as possible. If you do not need  pain medication after your surgery it is ok to stop day one. Opioids include: Codeine, Hydrocodone(Norco, Vicodin), Oxycodone(Percocet, oxycontin) and hydromorphone amongst others.  Long term and even short term use of opiods can cause: Increased pain response Dependence Constipation Depression Respiratory depression And more.  Withdrawal symptoms can include Flu like symptoms Nausea, vomiting And more Techniques to manage these symptoms Hydrate well Eat regular healthy meals Stay active Use relaxation techniques(deep breathing, meditating, yoga) Do Not substitute Alcohol to help with tapering If you have been on  opioids for less than two weeks and do not have pain than it is ok to stop all together.  Plan to wean off of opioids This plan should start within one week post op of your joint replacement. Maintain the same interval or time between taking each dose and first decrease the dose.  Cut the total daily intake of opioids by one tablet each day Next start to increase the time between doses. The last dose that should be eliminated is the evening dose.      TED hose   Complete by: As directed    Use stockings (TED hose) for 2 weeks on both leg(s).  You may remove them at night for sleeping.        Follow-up Information     Durene Romans, MD. Schedule an appointment as soon as possible for a visit in 2 week(s).   Specialty: Orthopedic Surgery Contact information: [address] [name] [address] [phone]                  Signed: Cassandria Anger 09/09/2021, 10:53 AM
